# Patient Record
Sex: Male | Born: 1940 | Race: White | Hispanic: No | Marital: Single | Smoking: Never smoker
Health system: Southern US, Community
[De-identification: ages and names within clinical notes are randomized; demographics above are authoritative.]

## PROBLEM LIST (undated history)

## (undated) DIAGNOSIS — E291 Testicular hypofunction: Secondary | ICD-10-CM

## (undated) DIAGNOSIS — E785 Hyperlipidemia, unspecified: Secondary | ICD-10-CM

## (undated) DIAGNOSIS — R42 Dizziness and giddiness: Secondary | ICD-10-CM

## (undated) DIAGNOSIS — N401 Enlarged prostate with lower urinary tract symptoms: Secondary | ICD-10-CM

## (undated) DIAGNOSIS — I10 Essential (primary) hypertension: Secondary | ICD-10-CM

## (undated) DIAGNOSIS — R21 Rash and other nonspecific skin eruption: Secondary | ICD-10-CM

## (undated) DIAGNOSIS — M1712 Unilateral primary osteoarthritis, left knee: Secondary | ICD-10-CM

## (undated) DIAGNOSIS — N471 Phimosis: Secondary | ICD-10-CM

## (undated) DIAGNOSIS — N400 Enlarged prostate without lower urinary tract symptoms: Secondary | ICD-10-CM

## (undated) DIAGNOSIS — N472 Paraphimosis: Secondary | ICD-10-CM

## (undated) DIAGNOSIS — L309 Dermatitis, unspecified: Secondary | ICD-10-CM

## (undated) DIAGNOSIS — L409 Psoriasis, unspecified: Secondary | ICD-10-CM

## (undated) DIAGNOSIS — K219 Gastro-esophageal reflux disease without esophagitis: Secondary | ICD-10-CM

## (undated) DIAGNOSIS — D751 Secondary polycythemia: Secondary | ICD-10-CM

## (undated) DIAGNOSIS — N476 Balanoposthitis: Secondary | ICD-10-CM

## (undated) DIAGNOSIS — R55 Syncope and collapse: Secondary | ICD-10-CM

## (undated) HISTORY — DX: Benign prostatic hyperplasia without lower urinary tract symptoms: N40.0

## (undated) HISTORY — DX: Benign prostatic hyperplasia with lower urinary tract symptoms: N40.1

## (undated) HISTORY — DX: Gastro-esophageal reflux disease without esophagitis: K21.9

## (undated) HISTORY — DX: Testicular hypofunction: E29.1

## (undated) HISTORY — DX: Secondary polycythemia: D75.1

## (undated) HISTORY — PX: JOINT REPLACEMENT: SHX530

## (undated) HISTORY — DX: Phimosis: N47.1

## (undated) HISTORY — PX: KNEE SURGERY: SHX244

## (undated) HISTORY — PX: EYE SURGERY: SHX253

## (undated) HISTORY — DX: Balanoposthitis: N47.6

## (undated) HISTORY — DX: Hyperlipidemia, unspecified: E78.5

## (undated) HISTORY — PX: HERNIA REPAIR: SHX51

## (undated) HISTORY — DX: Rash and other nonspecific skin eruption: R21

## (undated) HISTORY — DX: Paraphimosis: N47.2

## (undated) HISTORY — DX: Essential (primary) hypertension: I10

---

## 2001-07-05 HISTORY — PX: KNEE SURGERY: SHX244

## 2006-01-04 ENCOUNTER — Ambulatory Visit: Payer: Self-pay | Admitting: Gastroenterology

## 2006-07-01 ENCOUNTER — Ambulatory Visit: Payer: Self-pay | Admitting: Cardiology

## 2009-12-24 ENCOUNTER — Ambulatory Visit: Payer: Self-pay | Admitting: Gastroenterology

## 2011-04-05 ENCOUNTER — Ambulatory Visit: Payer: Self-pay | Admitting: Internal Medicine

## 2011-05-06 ENCOUNTER — Ambulatory Visit: Payer: Self-pay | Admitting: Internal Medicine

## 2011-06-05 ENCOUNTER — Ambulatory Visit: Payer: Self-pay | Admitting: Internal Medicine

## 2011-07-06 ENCOUNTER — Ambulatory Visit: Payer: Self-pay | Admitting: Internal Medicine

## 2011-07-15 LAB — CBC CANCER CENTER
Eosinophil #: 0.1 x10 3/mm (ref 0.0–0.7)
Eosinophil %: 1.9 %
HCT: 47.1 % (ref 40.0–52.0)
Lymphocyte #: 1.5 x10 3/mm (ref 1.0–3.6)
Lymphocyte %: 26.2 %
MCV: 96 fL (ref 80–100)
Monocyte %: 6.9 %
Neutrophil #: 3.6 x10 3/mm (ref 1.4–6.5)
Platelet: 201 x10 3/mm (ref 150–440)
RBC: 4.91 10*6/uL (ref 4.40–5.90)

## 2011-08-06 ENCOUNTER — Ambulatory Visit: Payer: Self-pay | Admitting: Internal Medicine

## 2011-08-12 LAB — CBC CANCER CENTER
Basophil #: 0 x10 3/mm (ref 0.0–0.1)
Eosinophil %: 3.1 %
HCT: 47.3 % (ref 40.0–52.0)
HGB: 16.3 g/dL (ref 13.0–18.0)
Lymphocyte #: 1.2 x10 3/mm (ref 1.0–3.6)
Lymphocyte %: 25 %
MCH: 33.1 pg (ref 26.0–34.0)
MCHC: 34.5 g/dL (ref 32.0–36.0)
MCV: 96 fL (ref 80–100)
Monocyte #: 0.4 x10 3/mm (ref 0.0–0.7)
Monocyte %: 8.7 %
Neutrophil #: 3.1 x10 3/mm (ref 1.4–6.5)
Neutrophil %: 62.8 %
RBC: 4.94 10*6/uL (ref 4.40–5.90)
WBC: 4.9 x10 3/mm (ref 3.8–10.6)

## 2011-09-03 ENCOUNTER — Ambulatory Visit: Payer: Self-pay | Admitting: Internal Medicine

## 2011-09-03 ENCOUNTER — Ambulatory Visit: Payer: Self-pay | Admitting: Oncology

## 2011-09-10 LAB — CBC CANCER CENTER
Basophil #: 0 x10 3/mm (ref 0.0–0.1)
Basophil %: 0.6 %
HGB: 16.1 g/dL (ref 13.0–18.0)
Lymphocyte #: 1.3 x10 3/mm (ref 1.0–3.6)
MCH: 33.1 pg (ref 26.0–34.0)
MCHC: 34.6 g/dL (ref 32.0–36.0)
Neutrophil %: 63.5 %
Platelet: 171 x10 3/mm (ref 150–440)
RDW: 13.1 % (ref 11.5–14.5)

## 2011-10-04 ENCOUNTER — Ambulatory Visit: Payer: Self-pay | Admitting: Internal Medicine

## 2011-10-04 ENCOUNTER — Ambulatory Visit: Payer: Self-pay | Admitting: Oncology

## 2011-10-14 ENCOUNTER — Ambulatory Visit: Payer: Self-pay

## 2011-11-03 ENCOUNTER — Ambulatory Visit: Payer: Self-pay | Admitting: Internal Medicine

## 2011-12-03 LAB — CANCER CENTER HEMOGLOBIN: HGB: 15.4 g/dL (ref 13.0–18.0)

## 2011-12-04 ENCOUNTER — Ambulatory Visit: Payer: Self-pay | Admitting: Internal Medicine

## 2012-01-03 ENCOUNTER — Ambulatory Visit: Payer: Self-pay | Admitting: Internal Medicine

## 2012-01-28 LAB — CANCER CENTER HEMOGLOBIN: HGB: 16.2 g/dL (ref 13.0–18.0)

## 2012-02-03 ENCOUNTER — Ambulatory Visit: Payer: Self-pay

## 2012-02-03 ENCOUNTER — Ambulatory Visit: Payer: Self-pay | Admitting: Internal Medicine

## 2012-03-10 ENCOUNTER — Ambulatory Visit: Payer: Self-pay | Admitting: Oncology

## 2012-03-10 LAB — CANCER CENTER HEMOGLOBIN: HGB: 15.9 g/dL (ref 13.0–18.0)

## 2012-04-04 ENCOUNTER — Ambulatory Visit: Payer: Self-pay | Admitting: Oncology

## 2012-05-02 DIAGNOSIS — N3941 Urge incontinence: Secondary | ICD-10-CM | POA: Insufficient documentation

## 2012-05-02 DIAGNOSIS — Z79899 Other long term (current) drug therapy: Secondary | ICD-10-CM | POA: Insufficient documentation

## 2012-05-02 DIAGNOSIS — N138 Other obstructive and reflux uropathy: Secondary | ICD-10-CM | POA: Insufficient documentation

## 2012-05-02 DIAGNOSIS — D075 Carcinoma in situ of prostate: Secondary | ICD-10-CM | POA: Insufficient documentation

## 2012-05-02 DIAGNOSIS — E291 Testicular hypofunction: Secondary | ICD-10-CM | POA: Insufficient documentation

## 2012-05-02 DIAGNOSIS — R972 Elevated prostate specific antigen [PSA]: Secondary | ICD-10-CM | POA: Insufficient documentation

## 2012-05-02 DIAGNOSIS — R339 Retention of urine, unspecified: Secondary | ICD-10-CM | POA: Insufficient documentation

## 2012-11-01 DIAGNOSIS — D751 Secondary polycythemia: Secondary | ICD-10-CM | POA: Insufficient documentation

## 2012-11-01 DIAGNOSIS — E291 Testicular hypofunction: Secondary | ICD-10-CM | POA: Insufficient documentation

## 2014-05-13 DIAGNOSIS — Z85828 Personal history of other malignant neoplasm of skin: Secondary | ICD-10-CM | POA: Insufficient documentation

## 2014-09-23 ENCOUNTER — Ambulatory Visit: Payer: Self-pay | Admitting: Family Medicine

## 2014-10-22 ENCOUNTER — Other Ambulatory Visit: Payer: Self-pay | Admitting: Family Medicine

## 2014-10-22 DIAGNOSIS — R911 Solitary pulmonary nodule: Secondary | ICD-10-CM

## 2014-11-04 ENCOUNTER — Other Ambulatory Visit: Payer: Self-pay

## 2014-11-05 ENCOUNTER — Ambulatory Visit: Payer: Self-pay

## 2014-12-10 ENCOUNTER — Telehealth: Payer: Self-pay | Admitting: Urology

## 2014-12-10 NOTE — Telephone Encounter (Signed)
Pt called and left a Voice Mail about redness and drainage of a post-op area. Best contact # [phone] 12/10/14 maf

## 2014-12-11 NOTE — Telephone Encounter (Signed)
Pt stated at this time he spoke with someone and had that problem solved. Cw,lpn

## 2014-12-18 ENCOUNTER — Ambulatory Visit
Admission: RE | Admit: 2014-12-18 | Discharge: 2014-12-18 | Disposition: A | Discharge status: Home / Self Care | Payer: Medicare PPO | Source: Ambulatory Visit | Attending: Family Medicine | Admitting: Family Medicine

## 2014-12-18 DIAGNOSIS — R911 Solitary pulmonary nodule: Secondary | ICD-10-CM | POA: Insufficient documentation

## 2015-01-09 ENCOUNTER — Ambulatory Visit (INDEPENDENT_AMBULATORY_CARE_PROVIDER_SITE_OTHER): Payer: Self-pay | Admitting: Urology

## 2015-01-09 ENCOUNTER — Encounter: Payer: Self-pay | Admitting: Urology

## 2015-01-09 VITALS — BP 155/74 | HR 62 | Resp 18 | Ht 73.0 in | Wt 205.0 lb

## 2015-01-09 DIAGNOSIS — E785 Hyperlipidemia, unspecified: Secondary | ICD-10-CM | POA: Insufficient documentation

## 2015-01-09 DIAGNOSIS — N471 Phimosis: Secondary | ICD-10-CM

## 2015-01-09 DIAGNOSIS — K219 Gastro-esophageal reflux disease without esophagitis: Secondary | ICD-10-CM | POA: Insufficient documentation

## 2015-01-09 DIAGNOSIS — I10 Essential (primary) hypertension: Secondary | ICD-10-CM | POA: Insufficient documentation

## 2015-01-09 LAB — MICROSCOPIC EXAMINATION: BACTERIA UA: NONE SEEN

## 2015-01-09 LAB — URINALYSIS, COMPLETE
Bilirubin, UA: NEGATIVE
Glucose, UA: NEGATIVE
KETONES UA: NEGATIVE
Leukocytes, UA: NEGATIVE
NITRITE UA: NEGATIVE
PH UA: 5.5 (ref 5.0–7.5)
Protein, UA: NEGATIVE
RBC, UA: NEGATIVE
SPEC GRAV UA: 1.015 (ref 1.005–1.030)
Urobilinogen, Ur: 0.2 mg/dL (ref 0.2–1.0)

## 2015-01-09 NOTE — Progress Notes (Signed)
01/09/2015 9:48 AM   Al CorpusJames William Holmes 03/22/1941 [id number]  Referring provider: No referring provider defined for this encounter.  Chief Complaint  Patient presents with  . Follow-up  . Phimosis    HPI: Patient comes in 2 months postcircumcision. He had a hematoma post operatively which I had to express out through the incision. He has has no sequelae from this. Incision site and circumcision with good. He'll be seen in follow-up is necessary for any other problems. His family doctor doesn't excellent job with yearly PSA and gentle urinary exam.   PMH: History reviewed. No pertinent past medical history.  Surgical History: History reviewed. No pertinent past surgical history.  Home Medications:    Medication List       This list is accurate as of: 01/09/15  9:48 AM.  Always use your most recent med list.               amLODipine 10 MG tablet  Commonly known as:  NORVASC  Take by mouth.     aspirin EC 325 MG tablet  Take 325 mg by mouth.     betamethasone dipropionate 0.05 % cream  Commonly known as:  DIPROLENE     CENTRUM ULTRA MENS Tabs  Take by mouth.     clobetasol ointment 0.05 %  Commonly known as:  TEMOVATE  Apply to lower extremities up to daily (alternating with triamcinolone) for rash.     COZAAR 25 MG tablet  Generic drug:  losartan     finasteride 5 MG tablet  Commonly known as:  PROSCAR  Take 5 mg by mouth.     Fish Oil 1000 MG Caps  Take by mouth.     hydrochlorothiazide 25 MG tablet  Commonly known as:  HYDRODIURIL  Take 1 tablet by mouth daily.     metoprolol tartrate 25 MG tablet  Commonly known as:  LOPRESSOR  TAKE 1 TABLET (25 MG TOTAL) BY MOUTH 2 (TWO) TIMES DAILY.     nystatin-triamcinolone cream  Commonly known as:  MYCOLOG II     ranitidine 150 MG tablet  Commonly known as:  ZANTAC  Take by mouth.     spironolactone 25 MG tablet  Commonly known as:  ALDACTONE  Take by mouth.     TH VITAMIN E 1000 UNIT capsule    Generic drug:  vitamin E  Take by mouth.     triamcinolone cream 0.1 %  Commonly known as:  KENALOG  Apply to back, trunk, and legs up to twice daily as needed for itch.     vitamin C 1000 MG tablet  Take by mouth.        Allergies:  Allergies  Allergen Reactions  . Amoxicillin-Pot Clavulanate Other (See Comments)    Joint Pain in siblings- unknown in pt  . Codeine Sulfate Other (See Comments)    Family History: History reviewed. No pertinent family history.  Social History:  reports that he has never smoked. He does not have any smokeless tobacco history on file. He reports that he does not drink alcohol. His drug history is not on file.  WUJ:WJXBJYNROS:UROLOGY Frequent Urination?: No Hard to postpone urination?: No Burning/pain with urination?: No Get up at night to urinate?: No Leakage of urine?: No Urine stream starts and stops?: No Trouble starting stream?: No Do you have to strain to urinate?: No Blood in urine?: No Urinary tract infection?: No Sexually transmitted disease?: No Injury to kidneys or bladder?: No Painful intercourse?: No  Weak stream?: No Erection problems?: No Penile pain?: No Gastrointestinal Nausea?: No Vomiting?: No Indigestion/heartburn?: No Diarrhea?: No Constipation?: No Constitutional Fever: No Night sweats?: No Weight loss?: No Fatigue?: No Skin Skin rash/lesions?: No Itching?: No Eyes Blurred vision?: No Double vision?: No Ears/Nose/Throat Sore throat?: No Sinus problems?: No Hematologic/Lymphatic Swollen glands?: No Easy bruising?: No Cardiovascular Leg swelling?: No Chest pain?: No Respiratory Cough?: No Shortness of breath?: No Endocrine Excessive thirst?: No Musculoskeletal Back pain?: No Joint pain?: No Neurological Headaches?: No Dizziness?: No Psychologic Depression?: No Anxiety?: No    Urological Symptom Review     Physical Exam: BP 155/74 mmHg  Pulse 62  Resp 18  Ht  (1.854 m)  Wt 205 lb  (92.987 kg)  BMI 27.05 kg/m2  Constitutional:  Alert and oriented, No acute distress. HEENT: [place] AT, moist mucus membranes.  Trachea midline, no masses. Cardiovascular: No clubbing, cyanosis, or edema. Respiratory: Normal respiratory effort, no increased work of breathing. GI: Abdomen is soft, nontender, nondistended, no abdominal masses GU: No CVA tenderness. Well-healed circumcision site Skin: No rashes, bruises or suspicious lesions. Lymph: No cervical or inguinal adenopathy. Neurologic: Grossly intact, no focal deficits, moving all 4 extremities. Psychiatric: Normal mood and affect.  Laboratory Data: Lab Results  Component Value Date   WBC 4.9 09/10/2011   HGB 15.9 03/10/2012   HCT 46.4 09/10/2011   MCV 96 09/10/2011   PLT 171 09/10/2011    No results found for: CREATININE  No results found for: PSA  No results found for: TESTOSTERONE  No results found for: HGBA1C  Urinalysis No results found for: COLORURINE, APPEARANCEUR, LABSPEC, PHURINE, GLUCOSEU, HGBUR, BILIRUBINUR, KETONESUR, PROTEINUR, UROBILINOGEN, NITRITE, LEUKOCYTESUR  Pertinent Imaging: None  Assessment & Plan:  Well-healed circumcision site. Postoperative hematoma gone. Family physician will obtain yearly PSA and does rectal exam so the patient is now on a follow-up for prostate health evaluations.  1. Phimosis  - Urinalysis, Complete   No Follow-up on file.  Lorraine Lax, MD  [facility] [address] [place], [place] [address] [phone]

## 2015-05-19 ENCOUNTER — Ambulatory Visit: Payer: Medicare PPO | Admitting: Anesthesiology

## 2015-05-19 ENCOUNTER — Encounter
Admission: RE | Disposition: A | Discharge status: Home / Self Care | Payer: Self-pay | Source: Ambulatory Visit | Attending: Gastroenterology

## 2015-05-19 ENCOUNTER — Encounter: Payer: Self-pay | Admitting: *Deleted

## 2015-05-19 ENCOUNTER — Ambulatory Visit
Admission: RE | Admit: 2015-05-19 | Discharge: 2015-05-19 | Disposition: A | Discharge status: Home / Self Care | Payer: Medicare PPO | Source: Ambulatory Visit | Attending: Gastroenterology | Admitting: Gastroenterology

## 2015-05-19 DIAGNOSIS — Z881 Allergy status to other antibiotic agents status: Secondary | ICD-10-CM | POA: Diagnosis not present

## 2015-05-19 DIAGNOSIS — Z96651 Presence of right artificial knee joint: Secondary | ICD-10-CM | POA: Diagnosis not present

## 2015-05-19 DIAGNOSIS — D751 Secondary polycythemia: Secondary | ICD-10-CM | POA: Insufficient documentation

## 2015-05-19 DIAGNOSIS — N471 Phimosis: Secondary | ICD-10-CM | POA: Diagnosis not present

## 2015-05-19 DIAGNOSIS — Z880 Allergy status to penicillin: Secondary | ICD-10-CM | POA: Diagnosis not present

## 2015-05-19 DIAGNOSIS — Z800 Family history of malignant neoplasm of digestive organs: Secondary | ICD-10-CM | POA: Insufficient documentation

## 2015-05-19 DIAGNOSIS — Z885 Allergy status to narcotic agent status: Secondary | ICD-10-CM | POA: Diagnosis not present

## 2015-05-19 DIAGNOSIS — N401 Enlarged prostate with lower urinary tract symptoms: Secondary | ICD-10-CM | POA: Diagnosis not present

## 2015-05-19 DIAGNOSIS — N476 Balanoposthitis: Secondary | ICD-10-CM | POA: Insufficient documentation

## 2015-05-19 DIAGNOSIS — Z1211 Encounter for screening for malignant neoplasm of colon: Secondary | ICD-10-CM | POA: Insufficient documentation

## 2015-05-19 DIAGNOSIS — Z7982 Long term (current) use of aspirin: Secondary | ICD-10-CM | POA: Insufficient documentation

## 2015-05-19 DIAGNOSIS — K219 Gastro-esophageal reflux disease without esophagitis: Secondary | ICD-10-CM | POA: Diagnosis not present

## 2015-05-19 DIAGNOSIS — Z79899 Other long term (current) drug therapy: Secondary | ICD-10-CM | POA: Diagnosis not present

## 2015-05-19 DIAGNOSIS — Z8371 Family history of colonic polyps: Secondary | ICD-10-CM | POA: Diagnosis not present

## 2015-05-19 HISTORY — PX: COLONOSCOPY: SHX5424

## 2015-05-19 SURGERY — COLONOSCOPY
Anesthesia: General

## 2015-05-19 MED ORDER — LIDOCAINE HCL (CARDIAC) 20 MG/ML IV SOLN
INTRAVENOUS | Status: DC | PRN
  Administered 2015-05-19: 30 mg via INTRAVENOUS

## 2015-05-19 MED ORDER — SODIUM CHLORIDE 0.9 % IV SOLN: INTRAVENOUS | Status: DC

## 2015-05-19 MED ORDER — SODIUM CHLORIDE 0.9 % IV SOLN
INTRAVENOUS | Status: DC
  Administered 2015-05-19: 09:00:00 via INTRAVENOUS

## 2015-05-19 MED ORDER — PROPOFOL 500 MG/50ML IV EMUL
INTRAVENOUS | Status: DC | PRN
Start: 2015-05-19 — End: 2015-05-19
  Administered 2015-05-19: 100 ug/kg/min via INTRAVENOUS

## 2015-05-19 MED ORDER — PROPOFOL 10 MG/ML IV BOLUS
INTRAVENOUS | Status: DC | PRN
  Administered 2015-05-19: 100 mg via INTRAVENOUS
  Administered 2015-05-19: 40 mg via INTRAVENOUS

## 2015-05-19 NOTE — H&P (Signed)
Primary Care Physician:  [facility] DALE E, MD Primary Gastroenterologist:  Dr. Bluford Kaufmannh  Pre-Procedure History & Physical: HPI:  Earl OlpJames William Vierra is a 74 y.o. male is here for an colonoscopy.   Past Medical History  Diagnosis Date  . Hypertension   . Hyperlipidemia   . Polycythemia   . Hypogonadism in male   . GERD (gastroesophageal reflux disease)   . 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 Phimosis   . BPH (benign prostatic hyperplasia)   . Balanoposthitis   . Penile rash   . Paraphimosis   . Phimosis     Past Surgical History  Procedure Laterality Date  . Knee surgery    . Hernia repair      Prior to Admission medications   Medication Sig Start Date End Date Taking? Authorizing Provider  aspirin EC 325 MG tablet Take 325 mg by mouth. 05/02/12  Yes Historical Provider, MD  amLODipine (NORVASC) 10 MG tablet Take by mouth. 07/30/14   Historical Provider, MD  Ascorbic Acid (VITAMIN C) 1000 MG tablet Take by mouth.    Historical Provider, MD  betamethasone dipropionate (DIPROLENE) 0.05 % cream  07/06/14   Historical Provider, MD  clobetasol ointment (TEMOVATE) 0.05 % Apply to lower extremities up to daily (alternating with triamcinolone) for rash. 11/25/14   Historical Provider, MD  finasteride (PROSCAR) 5 MG tablet Take 5 mg by mouth. 09/10/13   Historical Provider, MD  hydrochlorothiazide (HYDRODIURIL) 25 MG tablet Take 1 tablet by mouth daily. 12/16/14   Historical Provider, MD  losartan (COZAAR) 25 MG tablet  10/07/14   Historical Provider, MD  metoprolol tartrate (LOPRESSOR) 25 MG tablet TAKE 1 TABLET (25 MG TOTAL) BY MOUTH 2 (TWO) TIMES DAILY. 11/04/14   Historical Provider, MD  Multiple Vitamins-Minerals (CENTRUM ULTRA MENS) TABS Take by mouth.    Historical Provider, MD  Omega-3 Fatty Acids (FISH OIL) 1000 MG CAPS Take by mouth.    Historical Provider, MD  ranitidine (ZANTAC) 150 MG tablet Take by mouth.    Historical Provider, MD  spironolactone  (ALDACTONE) 25 MG tablet Take by mouth. 12/13/14   Historical Provider, MD  triamcinolone cream (KENALOG) 0.1 % Apply to back, trunk, and legs up to twice daily as needed for itch. 11/25/14   Historical Provider, MD  vitamin E (TH VITAMIN E) 1000 UNIT capsule Take by mouth.    Historical Provider, MD    Allergies as of 04/25/2015 - Review Complete 01/09/2015  Allergen Reaction Noted  . Amoxicillin-pot clavulanate Other (See Comments) 01/09/2015  . Codeine sulfate Other (See Comments) 01/09/2015    Family History  Problem Relation Age of Onset  . Heart disease Father   . Stroke Mother   . Prostate cancer Neg Hx   . Bladder Cancer Neg Hx   . Kidney disease Neg Hx   . Heart disease Brother   . Liver disease Brother   . Liver disease Sister     Social History   Social History  . Marital Status: Single    Spouse Name: N/A  . Number of Children: N/A  . Years of Education: N/A   Occupational History  . Not on file.   Social History Main Topics  . Smoking status: Never Smoker   . Smokeless tobacco: Never Used  . Alcohol Use: No  . Drug Use: No  . Sexual Activity: Not on file   Other Topics Concern  . Not on file   Social History Narrative  Review of Systems: See HPI, otherwise negative ROS  Physical Exam: BP 144/81 mmHg  Pulse 59  Temp(Src) 97.7 F (36.5 C) (Tympanic)  Resp 18  Ht  (1.854 m)  Wt 92.987 kg (205 lb)  BMI 27.05 kg/m2  SpO2 97% General:   Alert,  pleasant and cooperative in NAD Head:  Normocephalic and atraumatic. Neck:  Supple; no masses or thyromegaly. Lungs:  Clear throughout to auscultation.    Heart:  Regular rate and rhythm. Abdomen:  Soft, nontender and nondistended. Normal bowel sounds, without guarding, and without rebound.   Neurologic:  Alert and  oriented x4;  grossly normal neurologically.  Impression/Plan: Earl Holmes is here for an colonoscopy to be performed for screening/family hx of colon polyps and  cancer.  Risks, benefits, limitations, and alternatives regarding  colonoscopy have been reviewed with the patient.  Questions have been answered.  All parties agreeable.   Cynthia Cogle, Ezzard Standing, MD  05/19/2015, 9:18 AM

## 2015-05-19 NOTE — Transfer of Care (Signed)
Immediate Anesthesia Transfer of Care Note  Patient: Earl Holmes  Procedure(s) Performed: Procedure(s): COLONOSCOPY (N/A)  Patient Location: Endoscopy Unit  Anesthesia Type:General  Level of Consciousness: sedated  Airway & Oxygen Therapy: Patient Spontanous Breathing and Patient connected to nasal cannula oxygen  Post-op Assessment: Report given to RN and Post -op Vital signs reviewed and stable  Post vital signs: Reviewed and stable  Last Vitals:  Filed Vitals:   05/19/15 0859  BP: 144/81  Pulse: 59  Temp: 36.5 C  Resp: 18    Complications: No apparent anesthesia complications

## 2015-05-19 NOTE — Anesthesia Preprocedure Evaluation (Signed)
Anesthesia Evaluation  Patient identified by MRN, date of birth, ID band Patient awake    Reviewed: Allergy & Precautions, H&P , NPO status , Patient's Chart, lab work & pertinent test results, reviewed documented beta blocker date and time   History of Anesthesia Complications Negative for: history of anesthetic complications  Airway Mallampati: III  TM Distance: >3 FB Neck ROM: full    Dental no notable dental hx. (+) Teeth Intact   Pulmonary neg pulmonary ROS,    Pulmonary exam normal breath sounds clear to auscultation       Cardiovascular Exercise Tolerance: Good hypertension, On Medications (-) angina(-) CAD, (-) Past MI, (-) Cardiac Stents and (-) CABG Normal cardiovascular exam(-) dysrhythmias (-) Valvular Problems/Murmurs Rhythm:regular Rate:Normal     Neuro/Psych negative neurological ROS  negative psych ROS   GI/Hepatic Neg liver ROS, GERD  Medicated and Controlled,  Endo/Other  negative endocrine ROS  Renal/GU negative Renal ROS  negative genitourinary   Musculoskeletal   Abdominal   Peds  Hematology negative hematology ROS (+)   Anesthesia Other Findings Past Medical History:   Hypertension                                                 Hyperlipidemia                                               Polycythemia                                                 Hypogonadism in male                                         GERD (gastroesophageal reflux disease)                       Enlarged prostate with lower urinary tract sym*              Phimosis                                                     BPH (benign prostatic hyperplasia)                           Balanoposthitis                                              Penile rash  Paraphimosis                                                 Phimosis                                                     Reproductive/Obstetrics negative OB ROS                             Anesthesia Physical Anesthesia Plan  ASA: II  Anesthesia Plan: General   Post-op Pain Management:    Induction:   Airway Management Planned:   Additional Equipment:   Intra-op Plan:   Post-operative Plan:   Informed Consent: I have reviewed the patients History and Physical, chart, labs and discussed the procedure including the risks, benefits and alternatives for the proposed anesthesia with the patient or authorized representative who has indicated his/her understanding and acceptance.   Dental Advisory Given  Plan Discussed with: Anesthesiologist and Surgeon  Anesthesia Plan Comments:         Anesthesia Quick Evaluation

## 2015-05-19 NOTE — Op Note (Signed)
[facility] Gastroenterology Patient Name: Earl Holmes Procedure Date: 05/19/2015 10:06 AM MRN: [id number] Account #: [id number] Date of Birth: 1941-01-31 Admit Type: Outpatient Age: 74 Room: [facility] ENDO ROOM 4 Gender: Male Note Status: Finalized Procedure:         Colonoscopy Indications:       Colon cancer screening in patient at increased risk:                     Family history of 1st-degree relative with colon polyps,                     Screening in patient at increased risk: Family history of                     1st-degree relative with colorectal cancer Providers:         Ezzard Standing. Bluford Kaufmann, MD Referring MD:      Marina Goodell (Referring MD) Medicines:         Monitored Anesthesia Care Complications:     No immediate complications. Procedure:         Pre-Anesthesia Assessment:                    - Prior to the procedure, a History and Physical was                     performed, and patient medications, allergies and                     sensitivities were reviewed. The patient's tolerance of                     previous anesthesia was reviewed.                    - The risks and benefits of the procedure and the sedation                     options and risks were discussed with the patient. All                     questions were answered and informed consent was obtained.                    - After reviewing the risks and benefits, the patient was                     deemed in satisfactory condition to undergo the procedure.                    After obtaining informed consent, the colonoscope was                     passed under direct vision. Throughout the procedure, the                     patient's blood pressure, pulse, and oxygen saturations                     were monitored continuously. The Olympus CF-Q160AL                     colonoscope (S#. [id number]) was introduced through the anus  and advanced to the the cecum, identified by  appendiceal                     orifice and ileocecal valve. The colonoscopy was performed                     without difficulty. The patient tolerated the procedure                     well. The quality of the bowel preparation was good. Findings:      The colon (entire examined portion) appeared normal. Impression:        - The entire examined colon is normal.                    - No specimens collected. Recommendation:    - Discharge patient to home.                    - The findings and recommendations were discussed with the                     patient. Procedure Code(s): --- Professional ---                    [id number], Colonoscopy, flexible; diagnostic, including                     collection of specimen(s) by brushing or washing, when                     performed (separate procedure) Diagnosis Code(s): --- Professional ---                    Z83.71, Family history of colonic polyps                    Z80.0, Family history of malignant neoplasm of digestive                     organs CPT copyright 2014 American Medical Association. All rights reserved. The codes documented in this report are preliminary and upon coder review may  be revised to meet current compliance requirements. Wallace CullensPaul Y Angelmarie Ponzo, MD 05/19/2015 10:27:06 AM This report has been signed electronically. Number of Addenda: 0 Note Initiated On: 05/19/2015 10:06 AM Scope Withdrawal Time: 0 hours 5 minutes 13 seconds  Total Procedure Duration: 0 hours 9 minutes 2 seconds       [facility], [facility]

## 2015-05-20 NOTE — Anesthesia Postprocedure Evaluation (Signed)
  Anesthesia Post-op Note  Patient: Earl Holmes  Procedure(s) Performed: Procedure(s): COLONOSCOPY (N/A)  Anesthesia type:General  Patient location: PACU  Post pain: Pain level controlled  Post assessment: Post-op Vital signs reviewed, Patient's Cardiovascular Status Stable, Respiratory Function Stable, Patent Airway and No signs of Nausea or vomiting  Post vital signs: Reviewed and stable  Last Vitals:  Filed Vitals:   05/19/15 1100  BP: 145/83  Pulse: 58  Temp:   Resp: 13    Level of consciousness: awake, alert  and patient cooperative  Complications: No apparent anesthesia complications

## 2015-05-21 ENCOUNTER — Encounter: Payer: Self-pay | Admitting: Gastroenterology

## 2018-07-18 ENCOUNTER — Encounter: Payer: Self-pay | Admitting: *Deleted

## 2018-07-18 ENCOUNTER — Other Ambulatory Visit: Payer: Self-pay

## 2018-07-20 NOTE — Discharge Instructions (Signed)

## 2018-07-24 NOTE — Anesthesia Preprocedure Evaluation (Addendum)
Anesthesia Evaluation  Patient identified by MRN, date of birth, ID band Patient awake    Reviewed: Allergy & Precautions, NPO status , Patient's Chart, lab work & pertinent test results  History of Anesthesia Complications Negative for: history of anesthetic complications  Airway Mallampati: II   Neck ROM: Full    Dental  (+)    Pulmonary neg pulmonary ROS,    Pulmonary exam normal breath sounds clear to auscultation       Cardiovascular Exercise Tolerance: Good hypertension, Normal cardiovascular exam Rhythm:Regular Rate:Normal     Neuro/Psych Vertigo     GI/Hepatic GERD  ,  Endo/Other  negative endocrine ROS  Renal/GU negative Renal ROS     Musculoskeletal   Abdominal   Peds  Hematology negative hematology ROS (+)   Anesthesia Other Findings BPH  Reproductive/Obstetrics                            Anesthesia Physical Anesthesia Plan  ASA: II  Anesthesia Plan: MAC   Post-op Pain Management:    Induction: Intravenous  PONV Risk Score and Plan: 1 and TIVA and Midazolam  Airway Management Planned: Natural Airway  Additional Equipment:   Intra-op Plan:   Post-operative Plan:   Informed Consent: I have reviewed the patients History and Physical, chart, labs and discussed the procedure including the risks, benefits and alternatives for the proposed anesthesia with the patient or authorized representative who has indicated his/her understanding and acceptance.       Plan Discussed with: CRNA  Anesthesia Plan Comments:        Anesthesia Quick Evaluation

## 2018-07-26 ENCOUNTER — Ambulatory Visit
Admission: RE | Admit: 2018-07-26 | Discharge: 2018-07-26 | Disposition: A | Discharge status: Home / Self Care | Payer: Medicare HMO | Attending: Ophthalmology | Admitting: Ophthalmology

## 2018-07-26 ENCOUNTER — Ambulatory Visit: Payer: Medicare HMO | Admitting: Anesthesiology

## 2018-07-26 ENCOUNTER — Encounter
Admission: RE | Disposition: A | Discharge status: Home / Self Care | Payer: Self-pay | Source: Home / Self Care | Attending: Ophthalmology

## 2018-07-26 DIAGNOSIS — N400 Enlarged prostate without lower urinary tract symptoms: Secondary | ICD-10-CM | POA: Insufficient documentation

## 2018-07-26 DIAGNOSIS — Z79899 Other long term (current) drug therapy: Secondary | ICD-10-CM | POA: Insufficient documentation

## 2018-07-26 DIAGNOSIS — Z7982 Long term (current) use of aspirin: Secondary | ICD-10-CM | POA: Diagnosis not present

## 2018-07-26 DIAGNOSIS — H2511 Age-related nuclear cataract, right eye: Secondary | ICD-10-CM | POA: Diagnosis not present

## 2018-07-26 DIAGNOSIS — I10 Essential (primary) hypertension: Secondary | ICD-10-CM | POA: Insufficient documentation

## 2018-07-26 DIAGNOSIS — Z96651 Presence of right artificial knee joint: Secondary | ICD-10-CM | POA: Insufficient documentation

## 2018-07-26 HISTORY — DX: Dizziness and giddiness: R42

## 2018-07-26 HISTORY — DX: Dermatitis, unspecified: L30.9

## 2018-07-26 HISTORY — PX: CATARACT EXTRACTION W/PHACO: SHX586

## 2018-07-26 HISTORY — DX: Psoriasis, unspecified: L40.9

## 2018-07-26 SURGERY — PHACOEMULSIFICATION, CATARACT, WITH IOL INSERTION
Anesthesia: Monitor Anesthesia Care | Laterality: Right

## 2018-07-26 MED ORDER — FENTANYL CITRATE (PF) 100 MCG/2ML IJ SOLN
INTRAMUSCULAR | Status: DC | PRN
  Administered 2018-07-26 (×2): 50 ug via INTRAVENOUS

## 2018-07-26 MED ORDER — MIDAZOLAM HCL 2 MG/2ML IJ SOLN
INTRAMUSCULAR | Status: DC | PRN
  Administered 2018-07-26 (×2): 1 mg via INTRAVENOUS

## 2018-07-26 MED ORDER — ARMC OPHTHALMIC DILATING DROPS
1.0000 "application " | OPHTHALMIC | Status: DC | PRN
  Administered 2018-07-26 (×3): 1 via OPHTHALMIC

## 2018-07-26 MED ORDER — TETRACAINE HCL 0.5 % OP SOLN
1.0000 [drp] | OPHTHALMIC | Status: DC | PRN
  Administered 2018-07-26 (×2): 1 [drp] via OPHTHALMIC

## 2018-07-26 MED ORDER — ONDANSETRON HCL 4 MG/2ML IJ SOLN: 4.0000 mg | Freq: Once | INTRAMUSCULAR | Status: DC | PRN

## 2018-07-26 MED ORDER — BRIMONIDINE TARTRATE-TIMOLOL 0.2-0.5 % OP SOLN
OPHTHALMIC | Status: DC | PRN
  Administered 2018-07-26: 1 [drp] via OPHTHALMIC

## 2018-07-26 MED ORDER — LIDOCAINE HCL (PF) 2 % IJ SOLN
INTRAOCULAR | Status: DC | PRN
  Administered 2018-07-26: 1 mL via INTRAMUSCULAR

## 2018-07-26 MED ORDER — ACETAMINOPHEN 160 MG/5ML PO SOLN: 325.0000 mg | ORAL | Status: DC | PRN

## 2018-07-26 MED ORDER — EPINEPHRINE PF 1 MG/ML IJ SOLN
INTRAOCULAR | Status: DC | PRN
  Administered 2018-07-26: 77 mL via OPHTHALMIC

## 2018-07-26 MED ORDER — CEFUROXIME OPHTHALMIC INJECTION 1 MG/0.1 ML
INJECTION | OPHTHALMIC | Status: DC | PRN
  Administered 2018-07-26: .2 mL via INTRACAMERAL

## 2018-07-26 MED ORDER — LACTATED RINGERS IV SOLN: INTRAVENOUS | Status: DC

## 2018-07-26 MED ORDER — ACETAMINOPHEN 325 MG PO TABS: 650.0000 mg | ORAL_TABLET | Freq: Once | ORAL | Status: DC | PRN

## 2018-07-26 MED ORDER — MOXIFLOXACIN HCL 0.5 % OP SOLN
1.0000 [drp] | OPHTHALMIC | Status: DC | PRN
  Administered 2018-07-26 (×3): 1 [drp] via OPHTHALMIC

## 2018-07-26 MED ORDER — NA HYALUR & NA CHOND-NA HYALUR 0.4-0.35 ML IO KIT
PACK | INTRAOCULAR | Status: DC | PRN
  Administered 2018-07-26: 1 mL via INTRAOCULAR

## 2018-07-26 SURGICAL SUPPLY — 26 items
CANNULA ANT/CHMB 27G (MISCELLANEOUS) ×1 IMPLANT
CANNULA ANT/CHMB 27GA (MISCELLANEOUS) ×3 IMPLANT
GLOVE SURG LX 7.5 STRW (GLOVE) ×2
GLOVE SURG LX STRL 7.5 STRW (GLOVE) ×1 IMPLANT
GLOVE SURG TRIUMPH 8.0 PF LTX (GLOVE) ×3 IMPLANT
GOWN STRL REUS W/ TWL LRG LVL3 (GOWN DISPOSABLE) ×2 IMPLANT
GOWN STRL REUS W/TWL LRG LVL3 (GOWN DISPOSABLE) ×4
LENS IOL TECNIS ITEC 18.5 (Intraocular Lens) ×2 IMPLANT
MARKER SKIN DUAL TIP RULER LAB (MISCELLANEOUS) ×3 IMPLANT
NDL FILTER BLUNT 18X1 1/2 (NEEDLE) ×1 IMPLANT
NDL RETROBULBAR .5 NSTRL (NEEDLE) IMPLANT
NEEDLE FILTER BLUNT 18X 1/2SAF (NEEDLE) ×2
NEEDLE FILTER BLUNT 18X1 1/2 (NEEDLE) ×1 IMPLANT
PACK CATARACT BRASINGTON (MISCELLANEOUS) ×3 IMPLANT
PACK EYE AFTER SURG (MISCELLANEOUS) ×3 IMPLANT
PACK OPTHALMIC (MISCELLANEOUS) ×3 IMPLANT
RING MALYGIN 7.0 (MISCELLANEOUS) IMPLANT
SUT ETHILON 10-0 CS-B-6CS-B-6 (SUTURE)
SUT VICRYL  9 0 (SUTURE)
SUT VICRYL 9 0 (SUTURE) IMPLANT
SUTURE EHLN 10-0 CS-B-6CS-B-6 (SUTURE) IMPLANT
SYR 3ML LL SCALE MARK (SYRINGE) ×3 IMPLANT
SYR 5ML LL (SYRINGE) ×3 IMPLANT
SYR TB 1ML LUER SLIP (SYRINGE) ×3 IMPLANT
WATER STERILE IRR 500ML POUR (IV SOLUTION) ×3 IMPLANT
WIPE NON LINTING 3.25X3.25 (MISCELLANEOUS) ×3 IMPLANT

## 2018-07-26 NOTE — H&P (Signed)

## 2018-07-26 NOTE — Op Note (Signed)
LOCATION:  [facility]   PREOPERATIVE DIAGNOSIS:    Nuclear sclerotic cataract right eye. H25.11   POSTOPERATIVE DIAGNOSIS:  Nuclear sclerotic cataract right eye.     PROCEDURE:  Phacoemusification with posterior chamber intraocular lens placement of the right eye   LENS:   Implant Name Type Inv. Item Serial No. Manufacturer Lot No. LRB No. Used  Tecnis 1 aspheric IOL Intraocular Lens  [id number] [employer]  Right 1     PCB00 18.5 D   ULTRASOUND TIME: 21 % of 1 minutes, 26 seconds.  CDE 18.3   SURGEON:  Deirdre Evener, MD   ANESTHESIA:  Topical with tetracaine drops and 2% Xylocaine jelly, augmented with 1% preservative-free intracameral lidocaine.    COMPLICATIONS:  None.   DESCRIPTION OF PROCEDURE:  The patient was identified in the holding room and transported to the operating room and placed in the supine position under the operating microscope.  The right eye was identified as the operative eye and it was prepped and draped in the usual sterile ophthalmic fashion.   A 1 millimeter clear-corneal paracentesis was made at the 12:00 position.  0.5 ml of preservative-free 1% lidocaine was injected into the anterior chamber. The anterior chamber was filled with Viscoat viscoelastic.  A 2.4 millimeter keratome was used to make a near-clear corneal incision at the 9:00 position.  A curvilinear capsulorrhexis was made with a cystotome and capsulorrhexis forceps.  Balanced salt solution was used to hydrodissect and hydrodelineate the nucleus.   Phacoemulsification was then used in stop and chop fashion to remove the lens nucleus and epinucleus.  The remaining cortex was then removed using the irrigation and aspiration handpiece. Provisc was then placed into the capsular bag to distend it for lens placement.  A lens was then injected into the capsular bag.  The remaining viscoelastic was aspirated.   Wounds were hydrated with balanced salt solution.  The anterior  chamber was inflated to a physiologic pressure with balanced salt solution.  No wound leaks were noted. Cefuroxime 0.1 ml of a 10mg /ml solution was injected into the anterior chamber for a dose of 1 mg of intracameral antibiotic at the completion of the case.   Timolol and Brimonidine drops were applied to the eye.  The patient was taken to the recovery room in stable condition without complications of anesthesia or surgery.   Lennyx Verdell 07/26/2018, 10:05 AM

## 2018-07-26 NOTE — Transfer of Care (Signed)
Immediate Anesthesia Transfer of Care Note  Patient: Earl Holmes  Procedure(s) Performed: CATARACT EXTRACTION PHACO AND INTRAOCULAR LENS PLACEMENT (IOC)  RIGHT (Right )  Patient Location: PACU  Anesthesia Type: MAC  Level of Consciousness: awake, alert  and patient cooperative  Airway and Oxygen Therapy: Patient Spontanous Breathing and Patient connected to supplemental oxygen  Post-op Assessment: Post-op Vital signs reviewed, Patient's Cardiovascular Status Stable, Respiratory Function Stable, Patent Airway and No signs of Nausea or vomiting  Post-op Vital Signs: Reviewed and stable  Complications: No apparent anesthesia complications

## 2018-07-26 NOTE — Anesthesia Procedure Notes (Signed)
Procedure Name: MAC Performed by: Izetta Dakin, CRNA Pre-anesthesia Checklist: Timeout performed, Patient being monitored, Suction available, Emergency Drugs available and Patient identified Patient Re-evaluated:Patient Re-evaluated prior to induction Oxygen Delivery Method: Nasal cannula

## 2018-07-26 NOTE — Anesthesia Postprocedure Evaluation (Signed)
Anesthesia Post Note  Patient: Earl Holmes  Procedure(s) Performed: CATARACT EXTRACTION PHACO AND INTRAOCULAR LENS PLACEMENT (IOC)  RIGHT (Right )  Patient location during evaluation: PACU Anesthesia Type: MAC Level of consciousness: awake and alert, oriented and patient cooperative Pain management: pain level controlled Vital Signs Assessment: post-procedure vital signs reviewed and stable Respiratory status: spontaneous breathing, nonlabored ventilation and respiratory function stable Cardiovascular status: blood pressure returned to baseline and stable Postop Assessment: adequate PO intake Anesthetic complications: no    Darrin Nipper

## 2018-07-27 ENCOUNTER — Encounter: Payer: Self-pay | Admitting: Ophthalmology

## 2018-08-31 ENCOUNTER — Encounter: Payer: Self-pay | Admitting: *Deleted

## 2018-08-31 ENCOUNTER — Other Ambulatory Visit: Payer: Self-pay

## 2018-09-07 NOTE — Discharge Instructions (Signed)

## 2018-09-12 ENCOUNTER — Ambulatory Visit: Payer: Medicare HMO | Admitting: Anesthesiology

## 2018-09-12 ENCOUNTER — Ambulatory Visit
Admission: RE | Admit: 2018-09-12 | Discharge: 2018-09-12 | Disposition: A | Discharge status: Home / Self Care | Payer: Medicare HMO | Attending: Ophthalmology | Admitting: Ophthalmology

## 2018-09-12 ENCOUNTER — Encounter
Admission: RE | Disposition: A | Discharge status: Home / Self Care | Payer: Self-pay | Source: Home / Self Care | Attending: Ophthalmology

## 2018-09-12 DIAGNOSIS — Z96651 Presence of right artificial knee joint: Secondary | ICD-10-CM | POA: Diagnosis not present

## 2018-09-12 DIAGNOSIS — Z79899 Other long term (current) drug therapy: Secondary | ICD-10-CM | POA: Insufficient documentation

## 2018-09-12 DIAGNOSIS — I10 Essential (primary) hypertension: Secondary | ICD-10-CM | POA: Diagnosis not present

## 2018-09-12 DIAGNOSIS — D751 Secondary polycythemia: Secondary | ICD-10-CM | POA: Insufficient documentation

## 2018-09-12 DIAGNOSIS — N400 Enlarged prostate without lower urinary tract symptoms: Secondary | ICD-10-CM | POA: Diagnosis not present

## 2018-09-12 DIAGNOSIS — H2512 Age-related nuclear cataract, left eye: Secondary | ICD-10-CM | POA: Insufficient documentation

## 2018-09-12 DIAGNOSIS — Z7982 Long term (current) use of aspirin: Secondary | ICD-10-CM | POA: Diagnosis not present

## 2018-09-12 HISTORY — PX: CATARACT EXTRACTION W/PHACO: SHX586

## 2018-09-12 SURGERY — PHACOEMULSIFICATION, CATARACT, WITH IOL INSERTION
Anesthesia: Monitor Anesthesia Care | Site: Eye | Laterality: Left

## 2018-09-12 MED ORDER — MOXIFLOXACIN HCL 0.5 % OP SOLN
1.0000 [drp] | OPHTHALMIC | Status: DC | PRN
  Administered 2018-09-12 (×3): 1 [drp] via OPHTHALMIC

## 2018-09-12 MED ORDER — TETRACAINE HCL 0.5 % OP SOLN
1.0000 [drp] | OPHTHALMIC | Status: DC | PRN
  Administered 2018-09-12 (×2): 1 [drp] via OPHTHALMIC

## 2018-09-12 MED ORDER — LACTATED RINGERS IV SOLN: INTRAVENOUS | Status: DC

## 2018-09-12 MED ORDER — CEFUROXIME OPHTHALMIC INJECTION 1 MG/0.1 ML
INJECTION | OPHTHALMIC | Status: DC | PRN
  Administered 2018-09-12: 0.1 mL via INTRACAMERAL

## 2018-09-12 MED ORDER — ACETAMINOPHEN 325 MG PO TABS: 325.0000 mg | ORAL_TABLET | ORAL | Status: DC | PRN

## 2018-09-12 MED ORDER — NA HYALUR & NA CHOND-NA HYALUR 0.4-0.35 ML IO KIT
PACK | INTRAOCULAR | Status: DC | PRN
  Administered 2018-09-12: 1 mL via INTRAOCULAR

## 2018-09-12 MED ORDER — MIDAZOLAM HCL 2 MG/2ML IJ SOLN
INTRAMUSCULAR | Status: DC | PRN
  Administered 2018-09-12 (×2): 1 mg via INTRAVENOUS

## 2018-09-12 MED ORDER — LIDOCAINE HCL (PF) 2 % IJ SOLN
INTRAOCULAR | Status: DC | PRN
  Administered 2018-09-12: 2 mL

## 2018-09-12 MED ORDER — PROPOFOL 10 MG/ML IV BOLUS
INTRAVENOUS | Status: DC | PRN
  Administered 2018-09-12: 30 mg via INTRAVENOUS

## 2018-09-12 MED ORDER — BRIMONIDINE TARTRATE-TIMOLOL 0.2-0.5 % OP SOLN
OPHTHALMIC | Status: DC | PRN
  Administered 2018-09-12: 1 [drp] via OPHTHALMIC

## 2018-09-12 MED ORDER — EPINEPHRINE PF 1 MG/ML IJ SOLN
INTRAOCULAR | Status: DC | PRN
  Administered 2018-09-12: 68 mL via OPHTHALMIC

## 2018-09-12 MED ORDER — ARMC OPHTHALMIC DILATING DROPS
1.0000 "application " | OPHTHALMIC | Status: DC | PRN
  Administered 2018-09-12 (×3): 1 via OPHTHALMIC

## 2018-09-12 MED ORDER — ACETAMINOPHEN 160 MG/5ML PO SOLN: 325.0000 mg | ORAL | Status: DC | PRN

## 2018-09-12 SURGICAL SUPPLY — 20 items
CANNULA ANT/CHMB 27G (MISCELLANEOUS) ×1 IMPLANT
CANNULA ANT/CHMB 27GA (MISCELLANEOUS) ×3 IMPLANT
GLOVE SURG LX 7.5 STRW (GLOVE) ×2
GLOVE SURG LX STRL 7.5 STRW (GLOVE) ×1 IMPLANT
GLOVE SURG TRIUMPH 8.0 PF LTX (GLOVE) ×3 IMPLANT
GOWN STRL REUS W/ TWL LRG LVL3 (GOWN DISPOSABLE) ×2 IMPLANT
GOWN STRL REUS W/TWL LRG LVL3 (GOWN DISPOSABLE) ×4
LENS IOL TECNIS ITEC 20.0 (Intraocular Lens) ×2 IMPLANT
MARKER SKIN DUAL TIP RULER LAB (MISCELLANEOUS) ×3 IMPLANT
NDL FILTER BLUNT 18X1 1/2 (NEEDLE) ×1 IMPLANT
NEEDLE FILTER BLUNT 18X 1/2SAF (NEEDLE) ×2
NEEDLE FILTER BLUNT 18X1 1/2 (NEEDLE) ×1 IMPLANT
PACK CATARACT BRASINGTON (MISCELLANEOUS) ×3 IMPLANT
PACK EYE AFTER SURG (MISCELLANEOUS) ×3 IMPLANT
PACK OPTHALMIC (MISCELLANEOUS) ×3 IMPLANT
SYR 3ML LL SCALE MARK (SYRINGE) ×3 IMPLANT
SYR 5ML LL (SYRINGE) ×3 IMPLANT
SYR TB 1ML LUER SLIP (SYRINGE) ×3 IMPLANT
WATER STERILE IRR 500ML POUR (IV SOLUTION) ×3 IMPLANT
WIPE NON LINTING 3.25X3.25 (MISCELLANEOUS) ×3 IMPLANT

## 2018-09-12 NOTE — Transfer of Care (Signed)
Immediate Anesthesia Transfer of Care Note  Patient: Earl Holmes  Procedure(s) Performed: CATARACT EXTRACTION PHACO AND INTRAOCULAR LENS PLACEMENT (IOC) LEFT (Left Eye)  Patient Location: PACU  Anesthesia Type: MAC  Level of Consciousness: awake, alert  and patient cooperative  Airway and Oxygen Therapy: Patient Spontanous Breathing and Patient connected to supplemental oxygen  Post-op Assessment: Post-op Vital signs reviewed, Patient's Cardiovascular Status Stable, Respiratory Function Stable, Patent Airway and No signs of Nausea or vomiting  Post-op Vital Signs: Reviewed and stable  Complications: No apparent anesthesia complications

## 2018-09-12 NOTE — Op Note (Signed)
OPERATIVE NOTE  Earl Holmes [id number] 09/12/2018   PREOPERATIVE DIAGNOSIS:  Nuclear sclerotic cataract left eye. H25.12   POSTOPERATIVE DIAGNOSIS:    Nuclear sclerotic cataract left eye.     PROCEDURE:  Phacoemusification with posterior chamber intraocular lens placement of the left eye   LENS:   Implant Name Type Inv. Item Serial No. Manufacturer Lot No. LRB No. Used  LENS IOL DIOP 20.0 - [id number] Intraocular Lens LENS IOL DIOP 20.0 [id number]  Left 1        ULTRASOUND TIME: 24  % of 1 minutes 22 seconds, CDE 19.8  SURGEON:  Deirdre Evener, MD   ANESTHESIA:  Topical with tetracaine drops and 2% Xylocaine jelly, augmented with 1% preservative-free intracameral lidocaine.    COMPLICATIONS:  None.   DESCRIPTION OF PROCEDURE:  The patient was identified in the holding room and transported to the operating room and placed in the supine position under the operating microscope.  The left eye was identified as the operative eye and it was prepped and draped in the usual sterile ophthalmic fashion.   A 1 millimeter clear-corneal paracentesis was made at the 1:30 position.  0.5 ml of preservative-free 1% lidocaine was injected into the anterior chamber.  The anterior chamber was filled with Viscoat viscoelastic.  A 2.4 millimeter keratome was used to make a near-clear corneal incision at the 10:30 position.  .  A curvilinear capsulorrhexis was made with a cystotome and capsulorrhexis forceps.  Balanced salt solution was used to hydrodissect and hydrodelineate the nucleus.   Phacoemulsification was then used in stop and chop fashion to remove the lens nucleus and epinucleus.  The remaining cortex was then removed using the irrigation and aspiration handpiece. Provisc was then placed into the capsular bag to distend it for lens placement.  A lens was then injected into the capsular bag.  The remaining viscoelastic was aspirated.   Wounds were hydrated with balanced salt  solution.  The anterior chamber was inflated to a physiologic pressure with balanced salt solution.  No wound leaks were noted. Cefuroxime 0.1 ml of a 10mg /ml solution was injected into the anterior chamber for a dose of 1 mg of intracameral antibiotic at the completion of the case.   Timolol and Brimonidine drops were applied to the eye.  The patient was taken to the recovery room in stable condition without complications of anesthesia or surgery.  [name] 09/12/2018, 11:13 AM

## 2018-09-12 NOTE — Anesthesia Postprocedure Evaluation (Signed)
Anesthesia Post Note  Patient: Earl Holmes  Procedure(s) Performed: CATARACT EXTRACTION PHACO AND INTRAOCULAR LENS PLACEMENT (IOC) LEFT (Left Eye)  Patient location during evaluation: PACU Anesthesia Type: MAC Level of consciousness: awake and alert Pain management: pain level controlled Vital Signs Assessment: post-procedure vital signs reviewed and stable Respiratory status: spontaneous breathing, nonlabored ventilation, respiratory function stable and patient connected to nasal cannula oxygen Cardiovascular status: stable and blood pressure returned to baseline Postop Assessment: no apparent nausea or vomiting Anesthetic complications: no    Alisa Graff

## 2018-09-12 NOTE — Anesthesia Procedure Notes (Signed)
Procedure Name: MAC Date/Time: 09/12/2018 10:52 AM Performed by: Cameron Ali, CRNA Pre-anesthesia Checklist: Patient identified, Emergency Drugs available, Suction available, Timeout performed and Patient being monitored Patient Re-evaluated:Patient Re-evaluated prior to induction Oxygen Delivery Method: Nasal cannula Placement Confirmation: positive ETCO2

## 2018-09-12 NOTE — Anesthesia Preprocedure Evaluation (Addendum)
Anesthesia Evaluation  Patient identified by MRN, date of birth, ID band Patient awake    Reviewed: Allergy & Precautions, H&P , NPO status , Patient's Chart, lab work & pertinent test results, reviewed documented beta blocker date and time   Airway Mallampati: II  TM Distance: >3 FB Neck ROM: full    Dental no notable dental hx.    Pulmonary neg pulmonary ROS,    Pulmonary exam normal breath sounds clear to auscultation       Cardiovascular Exercise Tolerance: Good hypertension, negative cardio ROS   Rhythm:regular Rate:Normal     Neuro/Psych vertigo negative psych ROS   GI/Hepatic Neg liver ROS, GERD  ,  Endo/Other  negative endocrine ROS  Renal/GU negative Renal ROS   Hypogonadism, BPH    Musculoskeletal   Abdominal   Peds  Hematology polycythemia   Anesthesia Other Findings   Reproductive/Obstetrics negative OB ROS                            Anesthesia Physical Anesthesia Plan  ASA: II  Anesthesia Plan: MAC   Post-op Pain Management:    Induction:   PONV Risk Score and Plan:   Airway Management Planned:   Additional Equipment:   Intra-op Plan:   Post-operative Plan:   Informed Consent: I have reviewed the patients History and Physical, chart, labs and discussed the procedure including the risks, benefits and alternatives for the proposed anesthesia with the patient or authorized representative who has indicated his/her understanding and acceptance.     Dental Advisory Given  Plan Discussed with: CRNA  Anesthesia Plan Comments:         Anesthesia Quick Evaluation

## 2018-09-12 NOTE — H&P (Signed)

## 2018-09-13 ENCOUNTER — Encounter: Payer: Self-pay | Admitting: Ophthalmology

## 2018-09-20 ENCOUNTER — Other Ambulatory Visit: Payer: Self-pay | Admitting: Family Medicine

## 2018-09-20 ENCOUNTER — Other Ambulatory Visit: Payer: Self-pay

## 2018-09-20 ENCOUNTER — Ambulatory Visit
Admission: RE | Admit: 2018-09-20 | Discharge: 2018-09-20 | Disposition: A | Discharge status: Home / Self Care | Payer: Medicare HMO | Source: Ambulatory Visit | Attending: Family Medicine | Admitting: Family Medicine

## 2018-09-20 DIAGNOSIS — M542 Cervicalgia: Secondary | ICD-10-CM

## 2019-06-14 ENCOUNTER — Other Ambulatory Visit: Payer: Self-pay | Admitting: Neurology

## 2019-06-14 DIAGNOSIS — R55 Syncope and collapse: Secondary | ICD-10-CM

## 2019-06-18 DIAGNOSIS — R55 Syncope and collapse: Secondary | ICD-10-CM | POA: Insufficient documentation

## 2019-06-24 ENCOUNTER — Ambulatory Visit
Admission: RE | Admit: 2019-06-24 | Discharge: 2019-06-24 | Disposition: A | Discharge status: Home / Self Care | Payer: Medicare HMO | Source: Ambulatory Visit | Attending: Neurology | Admitting: Neurology

## 2019-06-24 DIAGNOSIS — R55 Syncope and collapse: Secondary | ICD-10-CM | POA: Diagnosis present

## 2019-10-30 DIAGNOSIS — K644 Residual hemorrhoidal skin tags: Secondary | ICD-10-CM | POA: Insufficient documentation

## 2019-11-20 ENCOUNTER — Other Ambulatory Visit
Admission: RE | Admit: 2019-11-20 | Discharge: 2019-11-20 | Disposition: A | Discharge status: Home / Self Care | Payer: Medicare HMO | Source: Ambulatory Visit | Attending: Surgery | Admitting: Surgery

## 2019-11-20 DIAGNOSIS — Z01812 Encounter for preprocedural laboratory examination: Secondary | ICD-10-CM | POA: Diagnosis present

## 2019-11-20 DIAGNOSIS — Z20822 Contact with and (suspected) exposure to covid-19: Secondary | ICD-10-CM | POA: Diagnosis not present

## 2019-11-20 LAB — SARS CORONAVIRUS 2 (TAT 6-24 HRS): SARS Coronavirus 2: NEGATIVE

## 2019-11-21 ENCOUNTER — Encounter: Payer: Self-pay | Admitting: Surgery

## 2019-11-22 ENCOUNTER — Ambulatory Visit: Payer: Medicare HMO | Admitting: Registered Nurse

## 2019-11-22 ENCOUNTER — Encounter: Payer: Self-pay | Admitting: Surgery

## 2019-11-22 ENCOUNTER — Encounter
Admission: RE | Disposition: A | Discharge status: Home / Self Care | Payer: Self-pay | Source: Home / Self Care | Attending: Surgery

## 2019-11-22 ENCOUNTER — Ambulatory Visit
Admission: RE | Admit: 2019-11-22 | Discharge: 2019-11-22 | Disposition: A | Discharge status: Home / Self Care | Payer: Medicare HMO | Attending: Surgery | Admitting: Surgery

## 2019-11-22 DIAGNOSIS — Z1211 Encounter for screening for malignant neoplasm of colon: Secondary | ICD-10-CM | POA: Diagnosis present

## 2019-11-22 DIAGNOSIS — I10 Essential (primary) hypertension: Secondary | ICD-10-CM | POA: Insufficient documentation

## 2019-11-22 DIAGNOSIS — K640 First degree hemorrhoids: Secondary | ICD-10-CM | POA: Diagnosis not present

## 2019-11-22 DIAGNOSIS — Z79899 Other long term (current) drug therapy: Secondary | ICD-10-CM | POA: Diagnosis not present

## 2019-11-22 DIAGNOSIS — L409 Psoriasis, unspecified: Secondary | ICD-10-CM | POA: Insufficient documentation

## 2019-11-22 DIAGNOSIS — K644 Residual hemorrhoidal skin tags: Secondary | ICD-10-CM | POA: Diagnosis not present

## 2019-11-22 DIAGNOSIS — Z800 Family history of malignant neoplasm of digestive organs: Secondary | ICD-10-CM | POA: Diagnosis not present

## 2019-11-22 DIAGNOSIS — N400 Enlarged prostate without lower urinary tract symptoms: Secondary | ICD-10-CM | POA: Diagnosis not present

## 2019-11-22 DIAGNOSIS — Z7982 Long term (current) use of aspirin: Secondary | ICD-10-CM | POA: Insufficient documentation

## 2019-11-22 HISTORY — PX: COLONOSCOPY WITH PROPOFOL: SHX5780

## 2019-11-22 HISTORY — DX: Syncope and collapse: R55

## 2019-11-22 SURGERY — COLONOSCOPY WITH PROPOFOL
Anesthesia: General

## 2019-11-22 MED ORDER — PROPOFOL 10 MG/ML IV BOLUS
INTRAVENOUS | Status: DC | PRN
  Administered 2019-11-22: 100 mg via INTRAVENOUS

## 2019-11-22 MED ORDER — LIDOCAINE HCL (CARDIAC) PF 100 MG/5ML IV SOSY
PREFILLED_SYRINGE | INTRAVENOUS | Status: DC | PRN
  Administered 2019-11-22: 40 mg via INTRAVENOUS

## 2019-11-22 MED ORDER — SODIUM CHLORIDE 0.9 % IV SOLN: INTRAVENOUS | Status: DC

## 2019-11-22 MED ORDER — PROPOFOL 500 MG/50ML IV EMUL
INTRAVENOUS | Status: DC | PRN
  Administered 2019-11-22: 150 ug/kg/min via INTRAVENOUS

## 2019-11-22 NOTE — Anesthesia Preprocedure Evaluation (Addendum)
Anesthesia Evaluation  Patient identified by MRN, date of birth, ID band Patient awake    Reviewed: Allergy & Precautions, H&P , NPO status , reviewed documented beta blocker date and time   Airway Mallampati: II  TM Distance: >3 FB Neck ROM: full    Dental  (+) Teeth Intact, Caps   Pulmonary    Pulmonary exam normal        Cardiovascular hypertension, Normal cardiovascular exam     Neuro/Psych Vertigo & Syncope - neg cardiac W/U. 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 "Thinks my               blood pressure was dropping" No date: Vertigo     Comment:  major episode 07/10/17.  other minor since.  Currently               having some issues.  Past Surgical History: 07/26/2018: CATARACT EXTRACTION W/PHACO; Right     Comment:  Procedure: CATARACT EXTRACTION PHACO AND INTRAOCULAR               LENS PLACEMENT (IOC)  RIGHT;  Surgeon: Lockie Mola, MD;  Location[facility];  Service:               Ophthalmology;  Laterality: Right; 09/12/2018: CATARACT EXTRACTION W/PHACO; Left     Comment:  Procedure: CATARACT EXTRACTION PHACO AND INTRAOCULAR               LENS PLACEMENT (IOC) LEFT;  Surgeon: Lockie Mola, MD;  Location: [facility];  Service:               Ophthalmology;  Laterality: Left; 05/19/2015: COLONOSCOPY; N/A     Comment:  Procedure: COLONOSCOPY;  Surgeon: Wallace Cullens, MD;                Location: [facility] END[facility]COPY;  Service: Gastroenterology;                Laterality: N/A; No date: HERNIA REPAIR No date: KNEE SURGERY     Reproductive/Obstetrics                           Anesthesia Physical Anesthesia Plan  ASA: II  Anesthesia Plan: General   Post-op Pain Management:    Induction: Intravenous  PONV Risk Score and Plan: Treatment may vary due to age or medical condition and TIVA  Airway Management Planned: Nasal Cannula and Natural Airway  Additional Equipment:   Intra-op Plan:   Post-operative Plan:   Informed Consent: I have reviewed the patients History and Physical, chart, labs and discussed the procedure  including the risks, benefits and alternatives for the proposed anesthesia with the patient or authorized representative who has indicated his/her understanding and acceptance.     Dental Advisory Given  Plan Discussed with: CRNA  Anesthesia Plan Comments:         Anesthesia Quick Evaluation

## 2019-11-22 NOTE — Op Note (Signed)
[facility] Gastroenterology Patient Name: Camila Norville Procedure Date: 11/22/2019 11:56 AM MRN: [id number] Account #: [id number] Date of Birth: 03-Aug-1940 Admit Type: Outpatient Age: 79 Room: [facility] ENDO ROOM 1 Gender: Male Note Status: Finalized Procedure:             Colonoscopy Indications:           Screening in patient at increased risk: Family history                         of 1st-degree relative with colorectal cancer Providers:             Eliseo Squires MD, MD Referring MD:          Sofie Hartigan (Referring MD) Medicines:             Propofol per Anesthesia Complications:         No immediate complications. Procedure:             Pre-Anesthesia Assessment:                        - After reviewing the risks and benefits, the patient                         was deemed in satisfactory condition to undergo the                         procedure in an ambulatory setting.                        After obtaining informed consent, the colonoscope was                         passed under direct vision. Throughout the procedure,                         the patient's blood pressure, pulse, and oxygen                         saturations were monitored continuously. The                         Colonoscope was introduced through the anus and                         advanced to the the cecum, identified by the ileocecal                         valve. The colonoscopy was performed without                         difficulty. The patient tolerated the procedure well.                         The quality of the bowel preparation was good. Findings:      Skin tags were found on perianal exam.      Non-bleeding internal hemorrhoids were found during retroflexion. The       hemorrhoids were Grade I (internal hemorrhoids that do not prolapse).      The exam was otherwise without  abnormality. Impression:            - Perianal skin tags found on perianal exam.  - Non-bleeding internal hemorrhoids.                        - The examination was otherwise normal.                        - No specimens collected. Recommendation:        - Discharge patient to home.                        - Resume previous diet.                        - Written discharge instructions were provided to the                         patient. Procedure Code(s):     --- Professional ---                        Q3335, Colorectal cancer screening; colonoscopy on                         individual at high risk Diagnosis Code(s):     --- Professional ---                        Z80.0, Family history of malignant neoplasm of                         digestive organs                        K64.0, First degree hemorrhoids                        K64.4, Residual hemorrhoidal skin tags CPT copyright 2019 [employer]. All rights reserved. The codes documented in this report are preliminary and upon coder review may  be revised to meet current compliance requirements. Dr. Harrie Foreman, MD Arville Go MD, MD 11/22/2019 12:25:27 PM This report has been signed electronically. Number of Addenda: 0 Note Initiated On: 11/22/2019 11:56 AM Scope Withdrawal Time: 0 hours 10 minutes 28 seconds  Total Procedure Duration: 0 hours 15 minutes 12 seconds  Estimated Blood Loss:  Estimated blood loss: none.      [facility]

## 2019-11-22 NOTE — Transfer of Care (Signed)
Immediate Anesthesia Transfer of Care Note  Patient: [name]  Procedure(s) Performed: Procedure(s): COLONOSCOPY WITH PROPOFOL (N/A)  Patient Location: PACU and Endoscopy Unit  Anesthesia Type:General  Level of Consciousness: sedated  Airway & Oxygen Therapy: Patient Spontanous Breathing and Patient connected to nasal cannula oxygen  Post-op Assessment: Report given to RN and Post -op Vital signs reviewed and stable  Post vital signs: Reviewed and stable  Last Vitals:  Vitals:   11/22/19 1138 11/22/19 1227  BP: (!) 166/87 121/75  Pulse: 61 60  Resp: 16 17  Temp: 36.4 C (!) 36.1 C  SpO2: 98% 96%    Complications: No apparent anesthesia complications

## 2019-11-22 NOTE — Anesthesia Procedure Notes (Signed)
Date/Time: 11/22/2019 12:05 PM Performed by: Stormy Fabian, CRNA Pre-anesthesia Checklist: Patient identified, Emergency Drugs available, Suction available and Patient being monitored Patient Re-evaluated:Patient Re-evaluated prior to induction Oxygen Delivery Method: Nasal cannula Induction Type: IV induction Dental Injury: Teeth and Oropharynx as per pre-operative assessment  Comments: Nasal cannula with etCO2 monitoring

## 2019-11-22 NOTE — Anesthesia Postprocedure Evaluation (Signed)
Anesthesia Post Note  Patient: Earl Holmes  Procedure(s) Performed: COLONOSCOPY WITH PROPOFOL (N/A )  Patient location during evaluation: Endoscopy Anesthesia Type: General Level of consciousness: awake and alert Pain management: pain level controlled Vital Signs Assessment: post-procedure vital signs reviewed and stable Respiratory status: spontaneous breathing, nonlabored ventilation and respiratory function stable Cardiovascular status: blood pressure returned to baseline and stable Postop Assessment: no apparent nausea or vomiting Anesthetic complications: no     Last Vitals:  Vitals:   11/22/19 1247 11/22/19 1257  BP: (!) 149/85 139/83  Pulse: 71 62  Resp: 14 15  Temp:    SpO2: 98% 97%    Last Pain:  Vitals:   11/22/19 1257  TempSrc:   PainSc: 0-No pain                 Christia Reading

## 2019-11-22 NOTE — H&P (Signed)
Subjective:  CC: hemorrhoids and screening colonscopy  HPI: Earl Holmes is a 79 y.o. male who was referred by Dr. Carlis Abbott for evaluation of hemorrhoids. Symptoms were first noted several years ago. Symptoms did not start at work. Pain is intermittent, non-radiating. he has some rectal bleeding occurring occasionally on toilet paper. Bleeding is described as just notes blood on tissue paper. Patient is having rectal itching. Exacerbated by hard stool. Alleviated by nothing specific, including OTC. Associated with nothing specific.   Toilet habits: Daily, soft BMs, no straining.  Patient does not have a personal history of colon cancer. Patient does not have a personal history of IBD. Patient does not have to history of polyps. Last colonoscopy in 2016, recommended PRN due to advanced age.  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 He declined further confirmatory testing at that time, but returns to request another colonoscopy due to this recent finding as well brother with history or colon cancer, s/p resection.  Past Medical History: has a past medical history of BPH (benign prostatic hypertrophy), GERD (gastroesophageal reflux disease), Hyperlipidemia, Hypertension, and Other symptoms involving digestive system(787.99).  Past Surgical History: has a past surgical history that includes Joint replacement (2003); Hernia repair; Colonoscopy (12/24/2009); and Colonoscopy (05/19/2015).  Family History: family history includes Bone cancer in his brother; Breast cancer in his sister; Colon cancer in his brother; Colon polyps in his brother and sister; Coronary Artery Disease (Blocked arteries around heart) in his father; Stroke in his mother.  Social History: reports that he has never smoked. He has never used smokeless tobacco. He reports that he does not drink alcohol. No history on file for drug use.  Current Medications: has a  current medication list which includes the following prescription(s): amlodipine, ascorbic acid (vitamin c), aspirin, cholecalciferol (vitamin d3), eplerenone, finasteride, halobetasol, hydrochlorothiazide, losartan, mv,ca,min-iron-fa-lycopene, omega-3 fatty acids/fish oil, triamcinolone, and vitamin e (dl, acetate).  Allergies:  Allergies  Allergen Reactions  . Augmentin [Amoxicillin-Pot Clavulanate] Other (See Comments)  BOTH BROTHER & SISTER HAVE HAD SEVERE MYALGIAS WHEN TAKING AUGMENTIN OR PCN  . Codeine Sulfate Rash  . Penicillin V Potassium Other (See Comments)  BOTH BROTHER & SISTER HAVE HAD SEVERE MYALGIAS WHEN TAKING AUGMENTIN OR PCN   ROS: A comprehensive review of systems was asked and was negative. except noted in HPI   Objective:    BP 149/77  Pulse 63  Ht 185.4 cm (6\' 1" )  Wt (!) 113.4 kg (250 lb)  BMI 32.98 kg/m   Constitutional : alert, appears stated age, cooperative and no distress  Throat: no asymmetry, masses, or scars  Respiratory: clear to auscultation bilaterally  Cardiovascular: regular rate and rhythm, S1, S2 normal, no murmur, click, rub or gallop  Gastrointestinal: soft, non-tender; bowel sounds normal; no masses, no organomegaly.  Musculoskeletal: Steady gait and movement  Skin: Cool and moist, no visible surgical scars  Psychiatric: Normal affect, non-agitated, not confused  Genital/Rectal: Deferred for today.   LABS:  -n/a   RADS: N/a  Assessment:   external and grade one internal hemorrhoid . Still not enough symptoms to warrant proceeding with open hemmorhoidectomy with associated risks in my opinion.  We can proceed with screening colonoscopy as requested.  Plan:   1. Discussed risks/benefits/alternatives to Colonoscopy. Risks include but not limited to bleeding, perforation, inability to complete due to poor prep. Benefits include diagnostic and potential curative intent. Alternatives includes continued observation. He verbalized  understanding still wishes to  proceed with colonoscopy. This will be scheduled either at [facility] or ambulatory surgery center.

## 2019-11-23 ENCOUNTER — Encounter: Payer: Self-pay | Admitting: *Deleted

## 2020-07-25 DIAGNOSIS — M1712 Unilateral primary osteoarthritis, left knee: Secondary | ICD-10-CM | POA: Insufficient documentation

## 2020-07-31 ENCOUNTER — Other Ambulatory Visit: Payer: Self-pay

## 2020-07-31 ENCOUNTER — Encounter
Admission: RE | Admit: 2020-07-31 | Discharge: 2020-07-31 | Disposition: A | Discharge status: Home / Self Care | Payer: Medicare HMO | Source: Ambulatory Visit | Attending: Orthopedic Surgery | Admitting: Orthopedic Surgery

## 2020-07-31 DIAGNOSIS — Z01818 Encounter for other preprocedural examination: Secondary | ICD-10-CM | POA: Diagnosis not present

## 2020-07-31 DIAGNOSIS — Z20822 Contact with and (suspected) exposure to covid-19: Secondary | ICD-10-CM | POA: Diagnosis not present

## 2020-07-31 HISTORY — DX: Unilateral primary osteoarthritis, left knee: M17.12

## 2020-07-31 LAB — CBC
HCT: 44.4 % (ref 39.0–52.0)
Hemoglobin: 16.2 g/dL (ref 13.0–17.0)
MCH: 33.1 pg (ref 26.0–34.0)
MCHC: 36.5 g/dL — ABNORMAL HIGH (ref 30.0–36.0)
MCV: 90.6 fL (ref 80.0–100.0)
Platelets: 202 10*3/uL (ref 150–400)
RBC: 4.9 MIL/uL (ref 4.22–5.81)
RDW: 13.2 % (ref 11.5–15.5)
WBC: 5.2 10*3/uL (ref 4.0–10.5)
nRBC: 0 % (ref 0.0–0.2)

## 2020-07-31 LAB — URINALYSIS, ROUTINE W REFLEX MICROSCOPIC
Bilirubin Urine: NEGATIVE
Glucose, UA: NEGATIVE mg/dL
Hgb urine dipstick: NEGATIVE
Ketones, ur: NEGATIVE mg/dL
Leukocytes,Ua: NEGATIVE
Nitrite: NEGATIVE
Protein, ur: NEGATIVE mg/dL
Specific Gravity, Urine: 1.004 — ABNORMAL LOW (ref 1.005–1.030)
pH: 7 (ref 5.0–8.0)

## 2020-07-31 LAB — TYPE AND SCREEN
ABO/RH(D): O POS
Antibody Screen: NEGATIVE

## 2020-07-31 LAB — COMPREHENSIVE METABOLIC PANEL
ALT: 21 U/L (ref 0–44)
AST: 24 U/L (ref 15–41)
Albumin: 4.3 g/dL (ref 3.5–5.0)
Alkaline Phosphatase: 49 U/L (ref 38–126)
Anion gap: 10 (ref 5–15)
BUN: 15 mg/dL (ref 8–23)
CO2: 27 mmol/L (ref 22–32)
Calcium: 9.5 mg/dL (ref 8.9–10.3)
Chloride: 101 mmol/L (ref 98–111)
Creatinine, Ser: 0.77 mg/dL (ref 0.61–1.24)
GFR, Estimated: >60 mL/min (ref >60)
Glucose, Bld: 94 mg/dL (ref 70–99)
Potassium: 3.4 mmol/L — ABNORMAL LOW (ref 3.5–5.1)
Sodium: 138 mmol/L (ref 135–145)
Total Bilirubin: 0.9 mg/dL (ref 0.3–1.2)
Total Protein: 7 g/dL (ref 6.5–8.1)

## 2020-07-31 LAB — SURGICAL PCR SCREEN
MRSA, PCR: NEGATIVE
Staphylococcus aureus: NEGATIVE

## 2020-07-31 LAB — C-REACTIVE PROTEIN: CRP: <0.5 mg/dL (ref <1.0)

## 2020-07-31 LAB — SEDIMENTATION RATE: Sed Rate: 3 mm/hr (ref 0–20)

## 2020-07-31 LAB — PROTIME-INR
INR: 1 (ref 0.8–1.2)
Prothrombin Time: 13 seconds (ref 11.4–15.2)

## 2020-07-31 LAB — APTT: aPTT: 28 seconds (ref 24–36)

## 2020-07-31 NOTE — Patient Instructions (Addendum)
Your procedure is scheduled on: Monday, January 31 Report to the Registration Desk on the 1st floor of the [facility] To find out your arrival time, please call [phone] between 1PM - 3PM on: Friday, January 28  REMEMBER: Instructions that are not followed completely may result in serious medical risk, up to and including death; or upon the discretion of your surgeon and anesthesiologist your surgery may need to be rescheduled.  Do not eat food after midnight the night before surgery.  No gum chewing, lozengers or hard candies.  You may however, drink CLEAR liquids up to 2 hours before you are scheduled to arrive for your surgery. Do not drink anything within 2 hours of your scheduled arrival time.  Clear liquids include: - water  - apple juice without pulp - gatorade (not RED, PURPLE, OR BLUE) - black coffee or tea (Do NOT add milk or creamers to the coffee or tea) Do NOT drink anything that is not on this list.  In addition, your doctor has ordered for you to drink the provided  Ensure Pre-Surgery Clear Carbohydrate Drink  Drinking this carbohydrate drink up to two hours before surgery helps to reduce insulin resistance and improve patient outcomes. Please complete drinking 2 hours prior to scheduled arrival time.  TAKE THESE MEDICATIONS THE MORNING OF SURGERY WITH A SIP OF WATER:  1.  Amlodipine 2.  Finasteride  One week prior to surgery: starting now Stop ASPIRIN,  Anti-inflammatories (NSAIDS) such as Advil, Aleve, Ibuprofen, Motrin, Naproxen, Naprosyn and Aspirin based products such as Excedrin, Goodys Powder, BC Powder. Stop ANY OVER THE COUNTER supplements until after surgery. Stop Fish oil, Vitamin C, Vitamin E (However, you may continue taking Vitamin D, Vitamin B, and multivitamin up until the day before surgery.)  On the morning of surgery brush your teeth with toothpaste and water, you may rinse your mouth with mouthwash if you wish. Do not swallow any  toothpaste or mouthwash.  Do not wear jewelry.  Do not wear lotions, powders, or perfumes.   Do not shave body from the neck down 48 hours prior to surgery just in case you cut yourself which could leave a site for infection.  Also, freshly shaved skin may become irritated if using the CHG soap.  Do not bring valuables to the hospital. [facility] is not responsible for any missing/lost belongings or valuables.   Use CHG Soap as directed on instruction sheet.  Notify your doctor if there is any change in your medical condition (cold, fever, infection).  Wear comfortable clothing (specific to your surgery type) to the hospital.  Plan for stool softeners for home use; pain medications have a tendency to cause constipation. You can also help prevent constipation by eating foods high in fiber such as fruits and vegetables and drinking plenty of fluids as your diet allows.  After surgery, you can help prevent lung complications by doing breathing exercises.  Take deep breaths and cough every 1-2 hours. Your doctor may order a device called an Incentive Spirometer to help you take deep breaths.  If you are being discharged the day of surgery, you will not be allowed to drive home. You will need a responsible adult (18 years or older) to drive you home and stay with you that night.   If you are taking public transportation, you will need to have a responsible adult (18 years or older) with you. Please confirm with your physician that it is acceptable to use public transportation.  Please call the Pre-admissions Testing Dept. at [phone] if you have any questions about these instructions.  Visitation Policy:  Patients undergoing a surgery or procedure may have one family member or support person with them as long as that person is not COVID-19 positive or experiencing its symptoms.  That person may remain in the waiting area during the procedure.

## 2020-08-01 ENCOUNTER — Other Ambulatory Visit
Admission: RE | Admit: 2020-08-01 | Discharge: 2020-08-01 | Disposition: A | Discharge status: Home / Self Care | Payer: Medicare HMO | Source: Ambulatory Visit | Attending: Orthopedic Surgery | Admitting: Orthopedic Surgery

## 2020-08-01 DIAGNOSIS — Z01818 Encounter for other preprocedural examination: Secondary | ICD-10-CM | POA: Diagnosis not present

## 2020-08-01 LAB — URINE CULTURE
Culture: NO GROWTH
Special Requests: NORMAL

## 2020-08-01 LAB — SARS CORONAVIRUS 2 (TAT 6-24 HRS): SARS Coronavirus 2: NEGATIVE

## 2020-08-03 ENCOUNTER — Encounter: Payer: Self-pay | Admitting: Orthopedic Surgery

## 2020-08-03 NOTE — H&P (Signed)
ORTHOPAEDIC HISTORY & PHYSICAL Britnee Mcdevitt, Adelina Mings., MD - 07/22/2020 10:00 AM EST Chief Complaint: Chief Complaint  Patient presents with  . Knee Pain  Left knee osteoarthritis   Reason for Visit: The patient is a 80 y.o. male who presents today for reevaluation of his left knee. He reports a long history of progressive left knee pain. He localizes most of the pain along the medial aspect of the knee. He reports some swelling, some locking, and some giving way of the knee. The pain is aggravated by any weight bearing. The knee pain limits the patient's ability to ambulate long distances. The patient has not appreciated any significant improvement despite activity modification and OTC medications. He is not using any ambulatory aids. The patient states that the knee pain has progressed to the point that it is significantly interfering with his activities of daily living.  Medications: Current Outpatient Medications  Medication Sig Dispense Refill  . ascorbic acid (VITAMIN C) 1000 MG tablet Take 1,000 mg by mouth once daily.  Marland Kitchen aspirin 81 MG EC tablet Take 81 mg by mouth once daily  . cholecalciferol, vitamin D3, 1,250 mcg (50,000 unit) Tab Take 1 tablet by mouth once daily  . eplerenone (INSPRA) 25 MG tablet Take 1 tablet (25 mg total) by mouth once daily 90 tablet 3  . mv,Ca,min-iron-FA-lycopene (CENTRUM ULTRA MEN'S) 8 mg iron- 200 mcg-600 mcg Tab Take 1 tablet by mouth once daily  . omega-3 fatty acids/fish oil 340-1,000 mg capsule Take 1 capsule by mouth 2 (two) times daily.  Marland Kitchen triamcinolone 0.1 % cream Apply to back, trunk, and legs up to twice daily as needed for itch.  Marland Kitchen VITAMIN E, DL,TOCOPHERYL ACET, (VITAMIN E, DL, ACETATE,) 7,628 unit Cap Take 1,000 Units by mouth once daily  . amLODIPine (NORVASC) 10 MG tablet TAKE 1 TABLET BY MOUTH EVERY DAY 90 tablet 3  . finasteride (PROSCAR) 5 mg tablet TAKE 1 TABLET BY MOUTH EVERY DAY 90 tablet 3  . hydroCHLOROthiazide (HYDRODIURIL) 25 MG  tablet TAKE 1 TABLET BY MOUTH EVERY DAY 90 tablet 3  . losartan (COZAAR) 25 MG tablet TAKE 2 TABLETS BY MOUTH ONCE DAILY. 180 tablet 3   No current facility-administered medications for this visit.   Allergies: Allergies  Allergen Reactions  . Augmentin [Amoxicillin-Pot Clavulanate] Other (See Comments)  BOTH BROTHER & SISTER HAVE HAD SEVERE MYALGIAS WHEN TAKING AUGMENTIN OR PCN  . Codeine Sulfate Rash  . Penicillin V Potassium Other (See Comments)  BOTH BROTHER & SISTER HAVE HAD SEVERE MYALGIAS WHEN TAKING AUGMENTIN OR PCN   Past Medical History: Past Medical History:  Diagnosis Date  . BPH (benign prostatic hypertrophy)  . GERD (gastroesophageal reflux disease)  . Hyperlipidemia  . Hypertension  . Other symptoms involving digestive system(787.99)   Past Surgical History: Past Surgical History:  Procedure Laterality Date  . COLONOSCOPY 12/24/2009  12/24/2009, 01/04/2006, 12/19/2000, 11/10/1998 all Normal  . COLONOSCOPY 05/19/2015  Entire examined colon is normal/FHx of colon cancer-Brother/FHx colon polyps-Sister/No Repeat/PYO  . HERNIA REPAIR  umbilical  . JOINT REPLACEMENT 2003  partial knee replacement   Social History: Social History   Socioeconomic History  . Marital status: Single  Spouse name: Not on file  . Number of children: Not on file  . Years of education: Not on file  . Highest education level: Not on file  Occupational History  . Not on file  Tobacco Use  . Smoking status: Never Smoker  . Smokeless tobacco: Never Used  Vaping Use  .  Vaping Use: Never used  Substance and Sexual Activity  . Alcohol use: No  . Drug use: Not on file  . Sexual activity: Not on file  Other Topics Concern  . Not on file  Social History Narrative  SH  Worked in Adult nurse [employer]  Single - MSM  No kids  lots of nieces and nephews -  TObacco - none  etoh - none   Social Determinants of Corporate investment banker Strain: Not on file  Food Insecurity: Not  on file  Transportation Needs: Not on file  Physical Activity: Not on file  Stress: Not on file  Social Connections: Not on file  Housing Stability: Not on file   Family History: Family History  Problem Relation Age of Onset  . Colon cancer Brother  . Colon polyps Brother  . Colon polyps Sister  . Breast cancer Sister  . Bone cancer Brother  . Stroke Mother  . Coronary Artery Disease (Blocked arteries around heart) Father  at an early age   Review of Systems: A comprehensive 14 point ROS was performed, reviewed, and the pertinent orthopaedic findings are documented in the HPI.  Exam BP 142/76  Temp 36.6 C (97.9 F)  Ht 185.4 cm (6\' 1" )  Wt 94.1 kg (207 lb 6.4 oz)  BMI 27.36 kg/m   General:  Well-developed, well-nourished male seen in no acute distress.  Antalgic gait. Varus thrust to the left knee.  HEENT:  Atraumatic, normocephalic. Pupils are equal and reactive to light. Extraocular motion is intact. Sclera are clear. Oropharynx is clear with moist mucosa.  Neck:  Supple, nontender, and with good ROM. No thyromegaly, adenopathy, JVD, or carotid bruits.  Lungs:  Clear to auscultation bilaterally.  Cardiovascular:  Regular rate and rhythm. Normal S1, S2. No murmur . No appreciable gallops or rubs. Peripheral pulses are palpable. No lower extremity edema. Homan`s test is negative.  Abdomen:  Soft, nontender, nondistended. Bowel sounds are present.  Extremities: Good strength, stability, and range of motion of the upper extremities. Good range of motion of the hips and ankles.  Left Knee: Soft tissue swelling: mild Effusion: none Erythema: none Crepitance: mild Tenderness: medial Alignment: relative varus Mediolateral laxity: medial pseudolaxity Posterior sag: negative Patellar tracking: Good tracking without evidence of subluxation or tilt Atrophy: No significant atrophy.  Quadriceps tone was fair to good. Range of motion: 0/2/121  degrees  Neurologic:  Awake, alert, and oriented.  Sensory function is intact to pinprick and light touch.  Motor strength is judged to be 5/5.  Motor coordination is within normal limits.  No apparent clonus. No tremor.   X-rays: I ordered and interpreted standing AP, lateral, and sunrise radiographs of the left knee that were obtained in the office today. There is severe narrowing of the medial cartilage space with bone-on-bone articulation and associated varus alignment. Osteophyte formation is noted. Subchondral sclerosis is noted. No evidence of fracture or dislocation.   Impression: Degenerative arthrosis of the left knee  Plan:  The findings were discussed in detail with the patient. The patient was given informational material on total knee replacement. Conservative treatment options were reviewed with the patient. We discussed the risks and benefits of surgical intervention. The usual perioperative course was also discussed in detail. The patient expressed understanding of the risks and benefits of surgical intervention and would like to proceed with plans for left total knee arthroplasty.  The patient lives alone but states that he has neighbors that will be available  to help him at home. He would like to consider same day surgery.  MEDICAL CLEARANCE: Per anesthesiology. ACTIVITY: As tolerated. WORK STATUS: Not applicable. THERAPY: Preoperative physical therapy evaluation. MEDICATIONS: Requested Prescriptions   No prescriptions requested or ordered in this encounter   FOLLOW-UP: Return for preop History & Physical pending surgery date.  Karey P. Angie Fava., M.D.  This note was generated in part with voice recognition software and I apologize for any typographical errors that were not detected and corrected.   Electronically signed by Shari Heritage., MD at 07/31/2020 11:04 AM EST

## 2020-08-04 ENCOUNTER — Ambulatory Visit: Payer: Medicare HMO

## 2020-08-04 ENCOUNTER — Other Ambulatory Visit: Payer: Self-pay

## 2020-08-04 ENCOUNTER — Ambulatory Visit: Payer: Medicare HMO | Admitting: Anesthesiology

## 2020-08-04 ENCOUNTER — Ambulatory Visit
Admission: RE | Admit: 2020-08-04 | Discharge: 2020-08-04 | Disposition: A | Discharge status: Home / Self Care | Payer: Medicare HMO | Attending: Orthopedic Surgery | Admitting: Orthopedic Surgery

## 2020-08-04 ENCOUNTER — Encounter: Payer: Self-pay | Admitting: Orthopedic Surgery

## 2020-08-04 ENCOUNTER — Encounter
Admission: RE | Disposition: A | Discharge status: Home / Self Care | Payer: Self-pay | Source: Home / Self Care | Attending: Orthopedic Surgery

## 2020-08-04 DIAGNOSIS — Z96651 Presence of right artificial knee joint: Secondary | ICD-10-CM | POA: Diagnosis not present

## 2020-08-04 DIAGNOSIS — Z79899 Other long term (current) drug therapy: Secondary | ICD-10-CM | POA: Diagnosis not present

## 2020-08-04 DIAGNOSIS — Z803 Family history of malignant neoplasm of breast: Secondary | ICD-10-CM | POA: Insufficient documentation

## 2020-08-04 DIAGNOSIS — Z800 Family history of malignant neoplasm of digestive organs: Secondary | ICD-10-CM | POA: Diagnosis not present

## 2020-08-04 DIAGNOSIS — Z823 Family history of stroke: Secondary | ICD-10-CM | POA: Insufficient documentation

## 2020-08-04 DIAGNOSIS — Z961 Presence of intraocular lens: Secondary | ICD-10-CM | POA: Diagnosis not present

## 2020-08-04 DIAGNOSIS — M1712 Unilateral primary osteoarthritis, left knee: Secondary | ICD-10-CM | POA: Diagnosis present

## 2020-08-04 DIAGNOSIS — Z96659 Presence of unspecified artificial knee joint: Secondary | ICD-10-CM

## 2020-08-04 DIAGNOSIS — Z96652 Presence of left artificial knee joint: Secondary | ICD-10-CM

## 2020-08-04 DIAGNOSIS — Z8249 Family history of ischemic heart disease and other diseases of the circulatory system: Secondary | ICD-10-CM | POA: Insufficient documentation

## 2020-08-04 DIAGNOSIS — Z808 Family history of malignant neoplasm of other organs or systems: Secondary | ICD-10-CM | POA: Insufficient documentation

## 2020-08-04 DIAGNOSIS — Z885 Allergy status to narcotic agent status: Secondary | ICD-10-CM | POA: Diagnosis not present

## 2020-08-04 DIAGNOSIS — Z881 Allergy status to other antibiotic agents status: Secondary | ICD-10-CM | POA: Diagnosis not present

## 2020-08-04 DIAGNOSIS — M25762 Osteophyte, left knee: Secondary | ICD-10-CM | POA: Diagnosis not present

## 2020-08-04 DIAGNOSIS — M2392 Unspecified internal derangement of left knee: Secondary | ICD-10-CM | POA: Insufficient documentation

## 2020-08-04 DIAGNOSIS — Z9841 Cataract extraction status, right eye: Secondary | ICD-10-CM | POA: Diagnosis not present

## 2020-08-04 DIAGNOSIS — Z7982 Long term (current) use of aspirin: Secondary | ICD-10-CM | POA: Diagnosis not present

## 2020-08-04 HISTORY — PX: KNEE ARTHROPLASTY: SHX992

## 2020-08-04 HISTORY — PX: APPLICATION OF WOUND VAC: SHX5189

## 2020-08-04 LAB — ABO/RH: ABO/RH(D): O POS

## 2020-08-04 LAB — IGE: IgE (Immunoglobulin E), Serum: 38 IU/mL (ref 6–495)

## 2020-08-04 SURGERY — ARTHROPLASTY, KNEE, TOTAL, USING IMAGELESS COMPUTER-ASSISTED NAVIGATION
Anesthesia: Spinal | Site: Knee | Laterality: Left

## 2020-08-04 MED ORDER — BUPIVACAINE HCL (PF) 0.25 % IJ SOLN
INTRAMUSCULAR | Status: AC
  Filled 2020-08-04: qty 60

## 2020-08-04 MED ORDER — DIPHENHYDRAMINE HCL 12.5 MG/5ML PO ELIX
12.5000 mg | ORAL_SOLUTION | ORAL | Status: DC | PRN
  Filled 2020-08-04: qty 10

## 2020-08-04 MED ORDER — CHLORHEXIDINE GLUCONATE 0.12 % MT SOLN
OROMUCOSAL | Status: AC
  Administered 2020-08-04: 15 mL via OROMUCOSAL
  Filled 2020-08-04: qty 15

## 2020-08-04 MED ORDER — BUPIVACAINE HCL (PF) 0.5 % IJ SOLN
INTRAMUSCULAR | Status: DC | PRN
  Administered 2020-08-04: 3 mL

## 2020-08-04 MED ORDER — TRANEXAMIC ACID-NACL 1000-0.7 MG/100ML-% IV SOLN
INTRAVENOUS | Status: AC
  Filled 2020-08-04: qty 100

## 2020-08-04 MED ORDER — OXYCODONE HCL 5 MG PO TABS: 5.0000 mg | ORAL_TABLET | ORAL | 0 refills | Status: DC | PRN

## 2020-08-04 MED ORDER — TRANEXAMIC ACID-NACL 1000-0.7 MG/100ML-% IV SOLN: 1000.0000 mg | Freq: Once | INTRAVENOUS | Status: AC

## 2020-08-04 MED ORDER — SURGIPHOR WOUND IRRIGATION SYSTEM - OPTIME
TOPICAL | Status: DC | PRN
  Administered 2020-08-04: 450 mL via TOPICAL

## 2020-08-04 MED ORDER — PROPOFOL 10 MG/ML IV BOLUS
INTRAVENOUS | Status: DC | PRN
  Administered 2020-08-04: 20 mg via INTRAVENOUS
  Administered 2020-08-04: 25 mg via INTRAVENOUS

## 2020-08-04 MED ORDER — GABAPENTIN 300 MG PO CAPS
ORAL_CAPSULE | ORAL | Status: AC
  Administered 2020-08-04: 300 mg via ORAL
  Filled 2020-08-04: qty 1

## 2020-08-04 MED ORDER — PROPOFOL 500 MG/50ML IV EMUL
INTRAVENOUS | Status: AC
  Filled 2020-08-04: qty 50

## 2020-08-04 MED ORDER — OXYCODONE HCL 5 MG/5ML PO SOLN: 5.0000 mg | Freq: Once | ORAL | Status: DC | PRN

## 2020-08-04 MED ORDER — NEOMYCIN-POLYMYXIN B GU 40-200000 IR SOLN
Status: DC | PRN
  Administered 2020-08-04: 16 mL

## 2020-08-04 MED ORDER — SODIUM CHLORIDE 0.9 % IV SOLN
INTRAVENOUS | Status: DC | PRN
  Administered 2020-08-04: 20 ug/min via INTRAVENOUS

## 2020-08-04 MED ORDER — BUPIVACAINE HCL (PF) 0.25 % IJ SOLN
INTRAMUSCULAR | Status: DC | PRN
  Administered 2020-08-04: 60 mL

## 2020-08-04 MED ORDER — CEFAZOLIN SODIUM-DEXTROSE 2-4 GM/100ML-% IV SOLN
INTRAVENOUS | Status: AC
  Administered 2020-08-04: 2 g via INTRAVENOUS
  Filled 2020-08-04: qty 100

## 2020-08-04 MED ORDER — PROPOFOL 500 MG/50ML IV EMUL
INTRAVENOUS | Status: DC | PRN
  Administered 2020-08-04: 50 ug/kg/min via INTRAVENOUS

## 2020-08-04 MED ORDER — GABAPENTIN 300 MG PO CAPS: 300.0000 mg | ORAL_CAPSULE | Freq: Once | ORAL | Status: AC

## 2020-08-04 MED ORDER — FAMOTIDINE 20 MG PO TABS: 20.0000 mg | ORAL_TABLET | Freq: Once | ORAL | Status: AC

## 2020-08-04 MED ORDER — ONDANSETRON HCL 4 MG/2ML IJ SOLN: 4.0000 mg | Freq: Once | INTRAMUSCULAR | Status: DC | PRN

## 2020-08-04 MED ORDER — CEFAZOLIN SODIUM-DEXTROSE 2-4 GM/100ML-% IV SOLN
2.0000 g | INTRAVENOUS | Status: AC
  Administered 2020-08-04: 2 g via INTRAVENOUS

## 2020-08-04 MED ORDER — PANTOPRAZOLE SODIUM 40 MG PO TBEC
40.0000 mg | DELAYED_RELEASE_TABLET | Freq: Two times a day (BID) | ORAL | Status: DC
  Administered 2020-08-04: 40 mg via ORAL
  Filled 2020-08-04: qty 1

## 2020-08-04 MED ORDER — MENTHOL 3 MG MT LOZG
1.0000 | LOZENGE | OROMUCOSAL | Status: DC | PRN
  Filled 2020-08-04: qty 9

## 2020-08-04 MED ORDER — PROPOFOL 10 MG/ML IV BOLUS
INTRAVENOUS | Status: AC
  Filled 2020-08-04: qty 20

## 2020-08-04 MED ORDER — PHENOL 1.4 % MT LIQD
1.0000 | OROMUCOSAL | Status: DC | PRN
  Filled 2020-08-04: qty 177

## 2020-08-04 MED ORDER — LACTATED RINGERS IV BOLUS
250.0000 mL | Freq: Once | INTRAVENOUS | Status: AC
  Administered 2020-08-04: 250 mL via INTRAVENOUS

## 2020-08-04 MED ORDER — ACETAMINOPHEN 325 MG PO TABS: 325.0000 mg | ORAL_TABLET | Freq: Four times a day (QID) | ORAL | Status: DC | PRN

## 2020-08-04 MED ORDER — CEFAZOLIN SODIUM-DEXTROSE 2-4 GM/100ML-% IV SOLN
INTRAVENOUS | Status: AC
  Filled 2020-08-04: qty 100

## 2020-08-04 MED ORDER — SODIUM CHLORIDE 0.9 % IV SOLN
INTRAVENOUS | Status: DC | PRN
  Administered 2020-08-04: 60 mL

## 2020-08-04 MED ORDER — FAMOTIDINE 20 MG PO TABS
ORAL_TABLET | ORAL | Status: AC
  Administered 2020-08-04: 20 mg via ORAL
  Filled 2020-08-04: qty 1

## 2020-08-04 MED ORDER — HYDROMORPHONE HCL 1 MG/ML IJ SOLN: 0.5000 mg | INTRAMUSCULAR | Status: DC | PRN

## 2020-08-04 MED ORDER — CEFAZOLIN SODIUM-DEXTROSE 2-4 GM/100ML-% IV SOLN: 2.0000 g | Freq: Four times a day (QID) | INTRAVENOUS | Status: DC

## 2020-08-04 MED ORDER — CELECOXIB 200 MG PO CAPS
ORAL_CAPSULE | ORAL | Status: AC
  Administered 2020-08-04: 400 mg via ORAL
  Filled 2020-08-04: qty 2

## 2020-08-04 MED ORDER — TRANEXAMIC ACID-NACL 1000-0.7 MG/100ML-% IV SOLN
INTRAVENOUS | Status: AC
  Administered 2020-08-04: 1000 mg via INTRAVENOUS
  Filled 2020-08-04: qty 100

## 2020-08-04 MED ORDER — CELECOXIB 200 MG PO CAPS: 400.0000 mg | ORAL_CAPSULE | Freq: Once | ORAL | Status: AC

## 2020-08-04 MED ORDER — ONDANSETRON HCL 4 MG PO TABS: 4.0000 mg | ORAL_TABLET | Freq: Four times a day (QID) | ORAL | Status: DC | PRN

## 2020-08-04 MED ORDER — SODIUM CHLORIDE 0.9 % IV SOLN: INTRAVENOUS | Status: DC

## 2020-08-04 MED ORDER — CELECOXIB 200 MG PO CAPS: 200.0000 mg | ORAL_CAPSULE | Freq: Two times a day (BID) | ORAL | 2 refills | Status: AC

## 2020-08-04 MED ORDER — ENSURE PRE-SURGERY PO LIQD
296.0000 mL | Freq: Once | ORAL | Status: AC
  Administered 2020-08-04: 296 mL via ORAL
  Filled 2020-08-04: qty 296

## 2020-08-04 MED ORDER — ENOXAPARIN SODIUM 40 MG/0.4ML ~~LOC~~ SOLN: 40.0000 mg | SUBCUTANEOUS | Status: DC

## 2020-08-04 MED ORDER — NEOMYCIN-POLYMYXIN B GU 40-200000 IR SOLN
Status: AC
  Filled 2020-08-04: qty 20

## 2020-08-04 MED ORDER — CELECOXIB 200 MG PO CAPS: 200.0000 mg | ORAL_CAPSULE | Freq: Two times a day (BID) | ORAL | Status: DC

## 2020-08-04 MED ORDER — OXYCODONE HCL 5 MG PO TABS: 5.0000 mg | ORAL_TABLET | ORAL | Status: DC | PRN

## 2020-08-04 MED ORDER — TRANEXAMIC ACID-NACL 1000-0.7 MG/100ML-% IV SOLN
1000.0000 mg | INTRAVENOUS | Status: AC
  Administered 2020-08-04: 1000 mg via INTRAVENOUS

## 2020-08-04 MED ORDER — BUPIVACAINE LIPOSOME 1.3 % IJ SUSP
INTRAMUSCULAR | Status: AC
  Filled 2020-08-04: qty 20

## 2020-08-04 MED ORDER — FLEET ENEMA 7-19 GM/118ML RE ENEM: 1.0000 | ENEMA | Freq: Once | RECTAL | Status: DC | PRN

## 2020-08-04 MED ORDER — DEXAMETHASONE SODIUM PHOSPHATE 10 MG/ML IJ SOLN
INTRAMUSCULAR | Status: AC
  Administered 2020-08-04: 8 mg via INTRAVENOUS
  Filled 2020-08-04: qty 1

## 2020-08-04 MED ORDER — CHLORHEXIDINE GLUCONATE 4 % EX LIQD
60.0000 mL | Freq: Once | CUTANEOUS | Status: AC
  Administered 2020-08-04: 4 via TOPICAL

## 2020-08-04 MED ORDER — FERROUS SULFATE 325 (65 FE) MG PO TABS
325.0000 mg | ORAL_TABLET | Freq: Two times a day (BID) | ORAL | Status: DC
Start: 2020-08-04 — End: 2020-08-04

## 2020-08-04 MED ORDER — ORAL CARE MOUTH RINSE: 15.0000 mL | Freq: Once | OROMUCOSAL | Status: AC

## 2020-08-04 MED ORDER — ONDANSETRON HCL 4 MG/2ML IJ SOLN: 4.0000 mg | Freq: Four times a day (QID) | INTRAMUSCULAR | Status: DC | PRN

## 2020-08-04 MED ORDER — MAGNESIUM HYDROXIDE 400 MG/5ML PO SUSP: 30.0000 mL | Freq: Every day | ORAL | Status: DC

## 2020-08-04 MED ORDER — ACETAMINOPHEN 10 MG/ML IV SOLN
INTRAVENOUS | Status: DC | PRN
  Administered 2020-08-04: 1000 mg via INTRAVENOUS

## 2020-08-04 MED ORDER — CHLORHEXIDINE GLUCONATE 0.12 % MT SOLN: 15.0000 mL | Freq: Once | OROMUCOSAL | Status: AC

## 2020-08-04 MED ORDER — LACTATED RINGERS IV SOLN: INTRAVENOUS | Status: DC

## 2020-08-04 MED ORDER — FENTANYL CITRATE (PF) 100 MCG/2ML IJ SOLN: 25.0000 ug | INTRAMUSCULAR | Status: DC | PRN

## 2020-08-04 MED ORDER — BISACODYL 10 MG RE SUPP
10.0000 mg | Freq: Every day | RECTAL | Status: DC | PRN
  Filled 2020-08-04: qty 1

## 2020-08-04 MED ORDER — ALUM & MAG HYDROXIDE-SIMETH 200-200-20 MG/5ML PO SUSP: 30.0000 mL | ORAL | Status: DC | PRN

## 2020-08-04 MED ORDER — SODIUM CHLORIDE FLUSH 0.9 % IV SOLN
INTRAVENOUS | Status: AC
  Filled 2020-08-04: qty 40

## 2020-08-04 MED ORDER — MAGNESIUM HYDROXIDE 400 MG/5ML PO SUSP
ORAL | Status: AC
  Administered 2020-08-04: 30 mL via ORAL
  Filled 2020-08-04: qty 30

## 2020-08-04 MED ORDER — ACETAMINOPHEN 10 MG/ML IV SOLN
INTRAVENOUS | Status: AC
  Filled 2020-08-04: qty 100

## 2020-08-04 MED ORDER — ONDANSETRON HCL 4 MG/2ML IJ SOLN
INTRAMUSCULAR | Status: DC | PRN
  Administered 2020-08-04: 4 mg via INTRAVENOUS

## 2020-08-04 MED ORDER — OXYCODONE HCL 5 MG PO TABS: 5.0000 mg | ORAL_TABLET | Freq: Once | ORAL | Status: DC | PRN

## 2020-08-04 MED ORDER — LACTATED RINGERS IV BOLUS
500.0000 mL | Freq: Once | INTRAVENOUS | Status: AC
  Administered 2020-08-04: 500 mL via INTRAVENOUS

## 2020-08-04 MED ORDER — ENOXAPARIN SODIUM 40 MG/0.4ML ~~LOC~~ SOLN: 40.0000 mg | SUBCUTANEOUS | 0 refills | Status: AC

## 2020-08-04 MED ORDER — DEXAMETHASONE SODIUM PHOSPHATE 10 MG/ML IJ SOLN: 8.0000 mg | Freq: Once | INTRAMUSCULAR | Status: AC

## 2020-08-04 SURGICAL SUPPLY — 80 items
ATTUNE MED DOME PAT 41 KNEE (Knees) ×1 IMPLANT
ATTUNE PS FEM LT SZ 7 CEM KNEE (Femur) ×1 IMPLANT
ATTUNE PSRP INSR SZ7 5 KNEE (Insert) ×1 IMPLANT
BASE TIBIAL ROT PLAT SZ 7 KNEE (Knees) IMPLANT
BATTERY INSTRU NAVIGATION (MISCELLANEOUS) ×8 IMPLANT
BLADE SAW 70X12.5 (BLADE) ×2 IMPLANT
BLADE SAW 90X13X1.19 OSCILLAT (BLADE) ×2 IMPLANT
BLADE SAW 90X25X1.19 OSCILLAT (BLADE) ×2 IMPLANT
BSPLAT TIB 7 CMNT ROT PLAT STR (Knees) ×1 IMPLANT
BTRY SRG DRVR LF (MISCELLANEOUS) ×4
CANISTER PREVENA PLUS 150 (CANNISTER) ×2 IMPLANT
CANISTER SUCT 3000ML PPV (MISCELLANEOUS) ×2 IMPLANT
CEMENT BONE GENTAMICIN (Cement) IMPLANT
CEMENT HV SMART SET (Cement) ×1 IMPLANT
COOLER POLAR GLACIER W/PUMP (MISCELLANEOUS) ×2 IMPLANT
COVER WAND RF STERILE (DRAPES) ×2 IMPLANT
CUFF TOURN SGL QUICK 24 (TOURNIQUET CUFF) ×2
CUFF TRNQT CYL 24X4X16.5-23 (TOURNIQUET CUFF) IMPLANT
DRAPE 3/4 80X56 (DRAPES) ×2 IMPLANT
DRSG DERMACEA 8X12 NADH (GAUZE/BANDAGES/DRESSINGS) ×2 IMPLANT
DRSG MEPILEX SACRM 8.7X9.8 (GAUZE/BANDAGES/DRESSINGS) ×2 IMPLANT
DRSG OPSITE POSTOP 4X14 (GAUZE/BANDAGES/DRESSINGS) ×2 IMPLANT
DRSG TEGADERM 4X4.75 (GAUZE/BANDAGES/DRESSINGS) ×2 IMPLANT
DURAPREP 26ML APPLICATOR (WOUND CARE) ×4 IMPLANT
ELECT REM PT RETURN 9FT ADLT (ELECTROSURGICAL) ×2
ELECTRODE REM PT RTRN 9FT ADLT (ELECTROSURGICAL) ×1 IMPLANT
EX-PIN ORTHOLOCK NAV 4X150 (PIN) ×4 IMPLANT
GLOVE BIO SURGEON STRL SZ7.5 (GLOVE) ×4 IMPLANT
GLOVE BIOGEL PI IND STRL 7.5 (GLOVE) ×1 IMPLANT
GLOVE BIOGEL PI INDICATOR 7.5 (GLOVE) ×1
GLOVE INDICATOR 8.0 STRL GRN (GLOVE) ×2 IMPLANT
GLOVE SURG ENC TEXT LTX SZ7.5 (GLOVE) ×4 IMPLANT
GOWN STRL REUS W/ TWL LRG LVL3 (GOWN DISPOSABLE) ×2 IMPLANT
GOWN STRL REUS W/ TWL XL LVL3 (GOWN DISPOSABLE) ×1 IMPLANT
GOWN STRL REUS W/TWL LRG LVL3 (GOWN DISPOSABLE) ×4
GOWN STRL REUS W/TWL XL LVL3 (GOWN DISPOSABLE) ×2
HEMOVAC 400CC 10FR (MISCELLANEOUS) ×2 IMPLANT
HOLDER FOLEY CATH W/STRAP (MISCELLANEOUS) ×2 IMPLANT
HOOD PEEL AWAY FLYTE STAYCOOL (MISCELLANEOUS) ×4 IMPLANT
IRRIGATION SURGIPHOR STRL (IV SOLUTION) ×2 IMPLANT
KIT PREVENA INCISION MGT20CM45 (CANNISTER) ×2 IMPLANT
KIT PUMP PREVENA PLUS 14DAY (MISCELLANEOUS) ×2 IMPLANT
KIT TURNOVER KIT A (KITS) ×2 IMPLANT
KNIFE SCULPS 14X20 (INSTRUMENTS) ×2 IMPLANT
LABEL OR SOLS (LABEL) ×2 IMPLANT
MANIFOLD NEPTUNE II (INSTRUMENTS) ×4 IMPLANT
NDL SAFETY ECLIPSE 18X1.5 (NEEDLE) ×1 IMPLANT
NDL SPNL 20GX3.5 QUINCKE YW (NEEDLE) ×2 IMPLANT
NEEDLE HYPO 18GX1.5 SHARP (NEEDLE) ×2
NEEDLE SPNL 20GX3.5 QUINCKE YW (NEEDLE) ×4 IMPLANT
NS IRRIG 500ML POUR BTL (IV SOLUTION) ×2 IMPLANT
PACK TOTAL KNEE (MISCELLANEOUS) ×2 IMPLANT
PAD ABD DERMACEA PRESS 5X9 (GAUZE/BANDAGES/DRESSINGS) ×2 IMPLANT
PAD WRAPON POLAR KNEE (MISCELLANEOUS) ×1 IMPLANT
PENCIL SMOKE ULTRAEVAC 22 CON (MISCELLANEOUS) ×2 IMPLANT
PIN DRILL QUICK PACK ×2 IMPLANT
PIN FIXATION 1/8DIA X 3INL (PIN) ×6 IMPLANT
PULSAVAC PLUS IRRIG FAN TIP (DISPOSABLE) ×2
SOL .9 NS 3000ML IRR  AL (IV SOLUTION) ×1
SOL .9 NS 3000ML IRR AL (IV SOLUTION) ×1
SOL .9 NS 3000ML IRR UROMATIC (IV SOLUTION) ×1 IMPLANT
SOL PREP PVP 2OZ (MISCELLANEOUS) ×2
SOLUTION PREP PVP 2OZ (MISCELLANEOUS) ×1 IMPLANT
SPONGE DRAIN TRACH 4X4 STRL 2S (GAUZE/BANDAGES/DRESSINGS) ×2 IMPLANT
STAPLER SKIN PROX 35W (STAPLE) ×2 IMPLANT
STOCKINETTE IMPERV 14X48 (MISCELLANEOUS) ×1 IMPLANT
STRAP TIBIA SHORT (MISCELLANEOUS) ×2 IMPLANT
SUCTION FRAZIER HANDLE 10FR (MISCELLANEOUS) ×1
SUCTION TUBE FRAZIER 10FR DISP (MISCELLANEOUS) ×1 IMPLANT
SUT VIC AB 0 CT1 36 (SUTURE) ×4 IMPLANT
SUT VIC AB 1 CT1 36 (SUTURE) ×4 IMPLANT
SUT VIC AB 2-0 CT2 27 (SUTURE) ×2 IMPLANT
SYR 20ML LL LF (SYRINGE) ×2 IMPLANT
SYR 30ML LL (SYRINGE) ×4 IMPLANT
TIBIAL BASE ROT PLAT SZ 7 KNEE (Knees) ×2 IMPLANT
TIP FAN IRRIG PULSAVAC PLUS (DISPOSABLE) ×1 IMPLANT
TOWEL OR 17X26 4PK STRL BLUE (TOWEL DISPOSABLE) ×2 IMPLANT
TOWER CARTRIDGE SMART MIX (DISPOSABLE) ×2 IMPLANT
TRAY FOLEY MTR SLVR 16FR STAT (SET/KITS/TRAYS/PACK) ×2 IMPLANT
WRAPON POLAR PAD KNEE (MISCELLANEOUS) ×2

## 2020-08-04 NOTE — Evaluation (Signed)
Physical Therapy Evaluation Patient Details Name: Earl Holmes MRN: [id number] DOB: 13-Dec-1940 Today's Date: 08/04/2020   History of Present Illness  80yo male pt s/p L TKA with PMHX including R TKA (2003), balanoposthitis, BPH, eczema, GERD, HLD, HTN, vertigo.  Clinical Impression  Pt is a pleasant 80 year old male who was admitted for L TKR. Pt performs bed mobility with supervision and transfers/ambulation with cga and RW. Pt demonstrates ability to perform 10 SLRs with independence, therefore does not require KI for mobility. Pt demonstrates deficits with strength/mobility/ROM. Pt demonstrates good safety awareness and has appropriate supervision for home discharge this date. RN and MD aware. Further recommendation for HHPT. Will discharge.    Follow Up Recommendations Home health PT    Equipment Recommendations  None recommended by PT    Recommendations for Other Services       Precautions / Restrictions Precautions Precautions: Fall;Knee Precaution Booklet Issued: Yes (comment) Restrictions Weight Bearing Restrictions: Yes LLE Weight Bearing: Weight bearing as tolerated      Mobility  Bed Mobility Overal bed mobility: Needs Assistance Bed Mobility: Supine to Sit;Sit to Supine     Supine to sit: Supervision Sit to supine: Supervision   General bed mobility comments: safe technique with upright posture    Transfers Overall transfer level: Needs assistance Equipment used: Rolling walker (2 wheeled) Transfers: Sit to/from Stand Sit to Stand: Min guard         General transfer comment: safe technique  Ambulation/Gait Ambulation/Gait assistance: Min guard Gait Distance (Feet): 70 Feet Assistive device: Rolling walker (2 wheeled) Gait Pattern/deviations: Step-to pattern     General Gait Details: initial step to gait pattern with ability to progress to reciprocal gait. Good weight acceptance on surgical extremity.  Stairs Stairs: Yes Stairs assistance:  Min guard Stair Management: Backwards;No rails;Step to pattern Number of Stairs: 2 General stair comments: Safe technique  Wheelchair Mobility    Modified Rankin (Stroke Patients Only)       Balance Overall balance assessment: Mild deficits observed, not formally tested                                           Pertinent Vitals/Pain Pain Assessment: 0-10 Pain Score: 2  Pain Location: L knee Pain Descriptors / Indicators: Operative site guarding Pain Intervention(s): Limited activity within patient's tolerance;Ice applied    Home Living Family/patient expects to be discharged to:: Private residence Living Arrangements: Alone Available Help at Discharge: Family;Available 24 hours/day (brother will be staying with him, also have neighbor support) Type of Home: House Home Access: Stairs to enter Entrance Stairs-Rails: None Entrance Stairs-Number of Steps: 2 Home Layout: One level Home Equipment: Walker - 2 wheels;Walker - 4 wheels;Cane - single point;Crutches Additional Comments: getting a grab bar installed soon    Prior Function Level of Independence: Independent         Comments: Pt independent at baseline, no falls, drives, retired     Journalist, newspaper   Dominant Hand: Right    Extremity/Trunk Assessment   Upper Extremity Assessment Upper Extremity Assessment: Overall WFL for tasks assessed    Lower Extremity Assessment Lower Extremity Assessment: Generalized weakness (L LE grossly 4/5; R LE grossly 5/5) LLE Deficits / Details: s/p L TKA, decreased ROM/strength, bandaging limiting active knee flexion       Communication   Communication: No difficulties  Cognition Arousal/Alertness: Awake/alert Behavior During  Therapy: WFL for tasks assessed/performed Overall Cognitive Status: Within Functional Limits for tasks assessed                                        General Comments General comments (skin integrity, edema,  etc.): hemovac in place, noted with drainage once standing and ambulating, RN aware    Exercises Total Joint Exercises Goniometric ROM: L knee AAROM: 0-80 degrees Other Exercises Other Exercises: Pt instructed in falls prevention, home/routines modifications, AE/DME, copmression stocking mgt, polar care mgt, and hemovac; handout provided to support recall and carryover Other Exercises: Supine ther-ex performed on L LE including AP, QS, SLRs, hip abd/add, SAQ, heel slides, LAQ, and knee flexion stretches. All ther-ex performed x 10 reps with cga. Educated on frequency/duration. Gave written HEP   Assessment/Plan    PT Assessment All further PT needs can be met in the next venue of care  PT Problem List Decreased strength;Decreased activity tolerance;Decreased balance;Decreased mobility;Pain       PT Treatment Interventions      PT Goals (Current goals can be found in the Care Plan section)  Acute Rehab PT Goals Patient Stated Goal: go home PT Goal Formulation: All assessment and education complete, DC therapy Time For Goal Achievement: 08/04/20 Potential to Achieve Goals: Good    Frequency     Barriers to discharge        Co-evaluation               AM-PAC PT "6 Clicks" Mobility  Outcome Measure Help needed turning from your back to your side while in a flat bed without using bedrails?: None Help needed moving from lying on your back to sitting on the side of a flat bed without using bedrails?: None Help needed moving to and from a bed to a chair (including a wheelchair)?: A Little Help needed standing up from a chair using your arms (e.g., wheelchair or bedside chair)?: A Little Help needed to walk in hospital room?: A Little Help needed climbing 3-5 steps with a railing? : A Little 6 Click Score: 20    End of Session Equipment Utilized During Treatment: Gait belt Activity Tolerance: Patient tolerated treatment well Patient left: in bed Nurse Communication:  Mobility status PT Visit Diagnosis: Muscle weakness (generalized) (M62.81);Difficulty in walking, not elsewhere classified (R26.2);Pain Pain - Right/Left: Left Pain - part of body: Knee    Time: 5638-7564 PT Time Calculation (min) (ACUTE ONLY): 38 min   Charges:   PT Evaluation $PT Eval Low Complexity: 1 Low PT Treatments $Gait Training: 8-22 mins $Therapeutic Exercise: 8-22 mins        Greggory Stallion, PT, DPT [phone]   Reising,Ballard Budney 08/04/2020, 4:19 PM

## 2020-08-04 NOTE — Anesthesia Preprocedure Evaluation (Addendum)
Anesthesia Evaluation  Patient identified by MRN, date of birth, ID band Patient awake    Reviewed: Allergy & Precautions, H&P , NPO status , Patient's Chart, lab work & pertinent test results  History of Anesthesia Complications Negative for: history of anesthetic complications  Airway Mallampati: II  TM Distance: >3 FB     Dental  (+) Poor Dentition   Pulmonary neg pulmonary ROS, neg sleep apnea, neg COPD,    breath sounds clear to auscultation       Cardiovascular hypertension, (-) angina(-) Past MI and (-) Cardiac Stents (-) dysrhythmias  Rhythm:regular Rate:Normal     Neuro/Psych negative neurological ROS  negative psych ROS   GI/Hepatic Neg liver ROS, GERD  Controlled,  Endo/Other  negative endocrine ROS  Renal/GU      Musculoskeletal   Abdominal   Peds  Hematology negative hematology ROS (+)   Anesthesia Other Findings Past Medical History: No date: Balanoposthitis No date: BPH (benign prostatic hyperplasia) No date: Degenerative arthritis of left knee No date: Eczema No date: Enlarged prostate with lower urinary tract symptoms (LUTS) No date: GERD (gastroesophageal reflux disease) No date: Hyperlipidemia No date: Hypertension 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 "Thinks my               blood pressure was dropping" No date: Vertigo     Comment:  major episode 07/10/17.  other minor since.  Currently               having some issues.  Past Surgical History: 07/26/2018: CATARACT EXTRACTION W/PHACO; Right     Comment:  Procedure: CATARACT EXTRACTION PHACO AND INTRAOCULAR               LENS PLACEMENT (IOC)  RIGHT;  Surgeon: Lockie Mola, MD;  Location: [facility];  Service:               Ophthalmology;  Laterality: Right; 09/12/2018: CATARACT EXTRACTION W/PHACO; Left     Comment:  Procedure: CATARACT EXTRACTION PHACO AND INTRAOCULAR               LENS PLACEMENT (IOC) LEFT;  Surgeon: Lockie Mola, MD;  Location: [facility];  Service:               Ophthalmology;  Laterality: Left; 05/19/2015: COLONOSCOPY; N/A     Comment:  Procedure: COLONOSCOPY;  Surgeon: Wallace Cullens, MD;                Location: [facility];  Service: Gastroenterology;                Laterality: N/A; 11/22/2019: COLONOSCOPY WITH PROPOFOL; N/A     Comment:  Procedure: COLONOSCOPY WITH PROPOFOL;  Surgeon: Sung Amabile, DO;  Location: [facility];  Service: General;               Laterality: N/A; No date: EYE SURGERY No date:  HERNIA REPAIR; Left     Comment:  inguinal No date: JOINT REPLACEMENT 2003: KNEE SURGERY; Right     Comment:  partial knee replacement  BMI    Body Mass Index: 26.18 kg/m      Reproductive/Obstetrics negative OB ROS                            Anesthesia Physical Anesthesia Plan  ASA: II  Anesthesia Plan: Spinal   Post-op Pain Management:    Induction:   PONV Risk Score and Plan: Propofol infusion and Ondansetron  Airway Management Planned:   Additional Equipment:   Intra-op Plan:   Post-operative Plan:   Informed Consent: I have reviewed the patients History and Physical, chart, labs and discussed the procedure including the risks, benefits and alternatives for the proposed anesthesia with the patient or authorized representative who has indicated his/her understanding and acceptance.     Dental Advisory Given  Plan Discussed with: Anesthesiologist, CRNA and Surgeon  Anesthesia Plan Comments:         Anesthesia Quick Evaluation

## 2020-08-04 NOTE — Transfer of Care (Signed)
Immediate Anesthesia Transfer of Care Note  Patient: Earl Holmes  Procedure(s) Performed: COMPUTER ASSISTED TOTAL KNEE ARTHROPLASTY (Left Knee) APPLICATION OF PERVENA WOUND VAC (Left Knee)  Patient Location: PACU  Anesthesia Type:Spinal  Level of Consciousness: awake, alert  and oriented  Airway & Oxygen Therapy: Patient Spontanous Breathing and Patient connected to face mask oxygen  Post-op Assessment: Report given to RN and Post -op Vital signs reviewed and stable  Post vital signs: Reviewed and stable  Last Vitals:  Vitals Value Taken Time  BP 133/75 08/04/20 1115  Temp 36.3 C 08/04/20 1111  Pulse 70 08/04/20 1116  Resp 16 08/04/20 1116  SpO2 92 % 08/04/20 1116  Vitals shown include unvalidated device data.  Last Pain:  Vitals:   08/04/20 1111  TempSrc:   PainSc: 0-No pain         Complications: No complications documented.

## 2020-08-04 NOTE — Op Note (Signed)
OPERATIVE NOTE  DATE OF SURGERY:  08/04/2020  PATIENT NAME:  Earl Holmes   DOB: 1940-12-20  MRN: [id number]  PRE-OPERATIVE DIAGNOSIS: Degenerative arthrosis of the left knee, primary  POST-OPERATIVE DIAGNOSIS:  Same  PROCEDURE:  Left total knee arthroplasty using computer-assisted navigation  SURGEON:  Jena Gauss. M.D.  ASSISTANT: Baldwin Jamaica, PA-C (present and scrubbed throughout the case, critical for assistance with exposure, retraction, instrumentation, and closure)  ANESTHESIA: spinal  ESTIMATED BLOOD LOSS: 50 mL  FLUIDS REPLACED: 1200 mL of crystalloid  TOURNIQUET TIME: 93 minutes  DRAINS: Wound VAC  SOFT TISSUE RELEASES: Anterior cruciate ligament, posterior cruciate ligament, deep medial collateral ligament, patellofemoral ligament  IMPLANTS UTILIZED: DePuy Attune size 7 posterior stabilized femoral component (cemented), size 7 rotating platform tibial component (cemented), 41 mm medialized dome patella (cemented), and a 5 mm stabilized rotating platform polyethylene insert.  INDICATIONS FOR SURGERY: Earl Holmes is a 80 y.o. year old male with a long history of progressive knee pain. X-rays demonstrated severe degenerative changes in tricompartmental fashion. The patient had not seen any significant improvement despite conservative nonsurgical intervention. After discussion of the risks and benefits of surgical intervention, the patient expressed understanding of the risks benefits and agree with plans for total knee arthroplasty.   The risks, benefits, and alternatives were discussed at length including but not limited to the risks of infection, bleeding, nerve injury, stiffness, blood clots, the need for revision surgery, cardiopulmonary complications, among others, and they were willing to proceed.  PROCEDURE IN DETAIL: The patient was brought into the operating room and, after adequate spinal anesthesia was achieved, a tourniquet was placed on the  patient's upper thigh. The patient's knee and leg were cleaned and prepped with alcohol and DuraPrep and draped in the usual sterile fashion. A "timeout" was performed as per usual protocol. The lower extremity was exsanguinated using an Esmarch, and the tourniquet was inflated to 300 mmHg. An anterior longitudinal incision was made followed by a standard mid vastus approach. The deep fibers of the medial collateral ligament were elevated in a subperiosteal fashion off of the medial flare of the tibia so as to maintain a continuous soft tissue sleeve. The patella was subluxed laterally and the patellofemoral ligament was incised. Inspection of the knee demonstrated severe degenerative changes with full-thickness loss of articular cartilage. Osteophytes were debrided using a rongeur. Anterior and posterior cruciate ligaments were excised. Two 4.0 mm Schanz pins were inserted in the femur and into the tibia for attachment of the array of trackers used for computer-assisted navigation. Hip center was identified using a circumduction technique. Distal landmarks were mapped using the computer. The distal femur and proximal tibia were mapped using the computer. The distal femoral cutting guide was positioned using computer-assisted navigation so as to achieve a 5 distal valgus cut. The femur was sized and it was felt that a size 7 femoral component was appropriate. A size 7 femoral cutting guide was positioned and the anterior cut was performed and verified using the computer. This was followed by completion of the posterior and chamfer cuts. Femoral cutting guide for the central box was then positioned in the center box cut was performed.  Attention was then directed to the proximal tibia. Medial and lateral menisci were excised. The extramedullary tibial cutting guide was positioned using computer-assisted navigation so as to achieve a 0 varus-valgus alignment and 3 posterior slope. The cut was performed and  verified using the computer. The proximal tibia was sized and  it was felt that a size 7 tibial tray was appropriate. Tibial and femoral trials were inserted followed by insertion of a 5 mm polyethylene insert. This allowed for excellent mediolateral soft tissue balancing both in flexion and in full extension. Finally, the patella was cut and prepared so as to accommodate a 41 mm medialized dome patella. A patella trial was placed and the knee was placed through a range of motion with excellent patellar tracking appreciated. The femoral trial was removed after debridement of posterior osteophytes. The central post-hole for the tibial component was reamed followed by insertion of a keel punch. Tibial trials were then removed. Cut surfaces of bone were irrigated with copious amounts of normal saline using pulsatile lavage and then suctioned dry. Polymethylmethacrylate cement was prepared in the usual fashion using a vacuum mixer. Cement was applied to the cut surface of the proximal tibia as well as along the undersurface of a size 7 rotating platform tibial component. Tibial component was positioned and impacted into place. Excess cement was removed using Personal assistant. Cement was then applied to the cut surfaces of the femur as well as along the posterior flanges of the size 7 femoral component. The femoral component was positioned and impacted into place. Excess cement was removed using Personal assistant. A 5 mm polyethylene trial was inserted and the knee was brought into full extension with steady axial compression applied. Finally, cement was applied to the backside of a 41 mm medialized dome patella and the patellar component was positioned and patellar clamp applied. Excess cement was removed using Personal assistant. After adequate curing of the cement, the tourniquet was deflated after a total tourniquet time of 93 minutes. Hemostasis was achieved using electrocautery. The knee was irrigated with copious amounts  of normal saline using pulsatile lavage followed by 500 ml of Surgiphor and then suctioned dry. 20 mL of 1.3% Exparel and 60 mL of 0.25% Marcaine in 40 mL of normal saline was injected along the posterior capsule, medial and lateral gutters, and along the arthrotomy site. A 5 mm stabilized rotating platform polyethylene insert was inserted and the knee was placed through a range of motion with excellent mediolateral soft tissue balancing appreciated and excellent patellar tracking noted. The medial parapatellar portion of the incision was reapproximated using interrupted sutures of #1 Vicryl. Subcutaneous tissue was approximated in layers using first #0 Vicryl followed #2-0 Vicryl. The skin was approximated with skin staples. A wound VAC and a sterile dressing were applied.  The patient tolerated the procedure well and was transported to the recovery room in stable condition.    Matthe P. Angie Fava., M.D.

## 2020-08-04 NOTE — Anesthesia Procedure Notes (Signed)
Spinal  Patient location during procedure: OR Start time: 08/04/2020 7:20 AM End time: 08/04/2020 7:24 AM Staffing Performed: resident/CRNA  Resident/CRNA: Nelda Marseille, CRNA Preanesthetic Checklist Completed: patient identified, IV checked, site marked, risks and benefits discussed, surgical consent, monitors and equipment checked, pre-op evaluation and timeout performed Spinal Block Patient position: sitting Prep: Betadine Patient monitoring: heart rate, continuous pulse ox, blood pressure and cardiac monitor Approach: midline Location: L4-5 Injection technique: single-shot Needle Needle type: Whitacre and Introducer  Needle gauge: 25 G Needle length: 9 cm Assessment Sensory level: T10 Additional Notes Negative paresthesia. Negative blood return. Positive free-flowing CSF. Expiration date of kit checked and confirmed. Patient tolerated procedure well, without complications.

## 2020-08-04 NOTE — Anesthesia Procedure Notes (Signed)
Date/Time: 08/04/2020 7:37 AM Performed by: Junious Silk, CRNA Pre-anesthesia Checklist: Patient identified, Emergency Drugs available, Suction available, Patient being monitored and Timeout performed Oxygen Delivery Method: Simple face mask

## 2020-08-04 NOTE — Evaluation (Signed)
Occupational Therapy Evaluation Patient Details Name: Earl Holmes MRN: [id number] DOB: December 08, 1940 Today's Date: 08/04/2020    History of Present Illness 80yo male pt s/p L TKA with PMHX including R TKA (2003), balanoposthitis, BPH, eczema, GERD, HLD, HTN, vertigo.   Clinical Impression   Pt seen for OT evaluation this date, POD#1 from above surgery. Pt was independent in all ADL prior to surgery and is eager to return to PLOF with less pain and improved safety and independence. Pt lives alone in a 1 story home but reports plans to have his brother stay with him for a while to support his recovery. Pt currently requires PRN minimal assist for LB dressing while in seated position due to pain and limited AROM of L knee. Pt denied pain throughout session, only reporting mild "soreness" when ambulating with 2WW. CGA for ADL transfers after instruction in RW mgt/hand placement. Negotiated obstacles within hall with 2WW without difficulty. Pt instructed in polar care mgt, falls prevention strategies, home/routines modifications, DME/AE for LB bathing and dressing tasks, and compression stocking mgt. Pt verbalized understanding. Handout provided to support recall and carryover. Pt would benefit from skilled OT services including additional instruction in dressing techniques with or without assistive devices for dressing and bathing skills to support recall and carryover prior to discharge and ultimately to maximize safety, independence, and minimize falls risk and caregiver burden. Do not currently anticipate any OT needs following this hospitalization.      Follow Up Recommendations  No OT follow up    Equipment Recommendations  3 in 1 bedside commode    Recommendations for Other Services       Precautions / Restrictions Precautions Precautions: Fall Restrictions Weight Bearing Restrictions: Yes LLE Weight Bearing: Weight bearing as tolerated      Mobility Bed Mobility Overal bed  mobility: Needs Assistance Bed Mobility: Supine to Sit;Sit to Supine     Supine to sit: Supervision Sit to supine: Supervision        Transfers Overall transfer level: Needs assistance Equipment used: Rolling walker (2 wheeled) Transfers: Sit to/from Stand Sit to Stand: Min guard         General transfer comment: cues for hand placement prior to attempt, good carryover    Balance Overall balance assessment: Mild deficits observed, not formally tested                                         ADL either performed or assessed with clinical judgement   ADL Overall ADL's : Needs assistance/impaired                                       General ADL Comments: PRN Min A for compression stocking mgt on LLE and LB dressing/bathing. CGA for ADL transfers.     Vision Patient Visual Report: No change from baseline       Perception     Praxis      Pertinent Vitals/Pain Pain Assessment: No/denies pain (once standing and walking, pt reporting "soreness" but denies pain)     Hand Dominance Right   Extremity/Trunk Assessment Upper Extremity Assessment Upper Extremity Assessment: Overall WFL for tasks assessed   Lower Extremity Assessment Lower Extremity Assessment: Overall WFL for tasks assessed;LLE deficits/detail LLE Deficits / Details: s/p L TKA, decreased ROM/strength, bandaging  limiting active knee flexion       Communication Communication Communication: No difficulties   Cognition Arousal/Alertness: Awake/alert Behavior During Therapy: WFL for tasks assessed/performed Overall Cognitive Status: Within Functional Limits for tasks assessed                                     General Comments  hemovac in place, noted with drainage once standing and ambulating, RN aware    Exercises Other Exercises Other Exercises: Pt instructed in falls prevention, home/routines modifications, AE/DME, copmression stocking mgt,  polar care mgt, and hemovac; handout provided to support recall and carryover   Shoulder Instructions      Home Living Family/patient expects to be discharged to:: Private residence Living Arrangements: Alone Available Help at Discharge: Family;Available 24 hours/day (brother will be staying with him for a while) Type of Home: House Home Access: Stairs to enter [employer] of Steps: 2 steps from patio (plans to use this entrance) no rails, 7 steps w/ L rail from front of house Entrance Stairs-Rails: None Home Layout: One level     Bathroom Shower/Tub: Tub/shower unit;Walk-in shower   Bathroom Toilet: Handicapped height (has both)     Home Equipment: Walker - 2 wheels;Walker - 4 wheels;Cane - single point;Crutches   Additional Comments: getting a grab bar installed soon      Prior Functioning/Environment Level of Independence: Independent        Comments: Pt independent at baseline, no falls, drives, retired        OT Problem List: Decreased strength;Decreased range of motion;Impaired balance (sitting and/or standing);Decreased knowledge of use of DME or AE      OT Treatment/Interventions: Self-care/ADL training;Therapeutic exercise;Therapeutic activities;DME and/or AE instruction;Patient/family education;Balance training    OT Goals(Current goals can be found in the care plan section) Acute Rehab OT Goals Patient Stated Goal: go home OT Goal Formulation: With patient Time For Goal Achievement: 08/18/20 Potential to Achieve Goals: Good ADL Goals Pt Will Perform Lower Body Dressing: with modified independence;sit to/from stand Pt Will Transfer to Toilet: with modified independence;ambulating;regular height toilet (LRAD for amb) Additional ADL Goal #1: Pt will independently instruct family/caregiver in polar care mgt, compression stocking mgt, and hemovac mgt  OT Frequency: Min 1X/week   Barriers to D/C:            Co-evaluation               AM-PAC OT "6 Clicks" Daily Activity     Outcome Measure Help from another person eating meals?: None Help from another person taking care of personal grooming?: None Help from another person toileting, which includes using toliet, bedpan, or urinal?: A Little Help from another person bathing (including washing, rinsing, drying)?: A Little Help from another person to put on and taking off regular upper body clothing?: None Help from another person to put on and taking off regular lower body clothing?: A Little 6 Click Score: 21   End of Session Equipment Utilized During Treatment: Gait belt;Rolling walker Nurse Communication: Mobility status  Activity Tolerance: Patient tolerated treatment well Patient left: in bed;with call bell/phone within reach  OT Visit Diagnosis: Other abnormalities of gait and mobility (R26.89)                Time: 1353-1430 OT Time Calculation (min): 37 min Charges:  OT General Charges $OT Visit: 1 Visit OT Evaluation $OT Eval Low Complexity: 1 Low OT  Treatments $Self Care/Home Management : 23-37 mins  Richrd Prime, MPH, MS, OTR/L ascom [phone] 08/04/20, 2:44 PM

## 2020-08-04 NOTE — Discharge Instructions (Signed)
Instructions after Total Knee Replacement   Earl Holmes., M.D.     Dept. of [facility]  [place] [place], [place]  [address]  Phone: [phone]   Fax: [phone]    DIET:  Drink plenty of non-alcoholic fluids.  Resume your normal diet. Include foods high in fiber.  ACTIVITY:   You may use crutches or a walker with weight-bearing as tolerated, unless instructed otherwise.  You may be weaned off of the walker or crutches by your Physical Therapist.   Do NOT place pillows under the knee. Anything placed under the knee could limit your ability to straighten the knee.    Continue doing gentle exercises. Exercising will reduce the pain and swelling, increase motion, and prevent muscle weakness.    Please continue to use the TED compression stockings for 6 weeks. You may remove the stockings at night, but should reapply them in the morning.  Do not drive or operate any equipment until instructed.  WOUND CARE:   Continue to use the PolarCare or ice packs periodically to reduce pain and swelling.  You may bathe or shower after the staples are removed at the first office visit following surgery.  MEDICATIONS:  You may resume your regular medications.  Please take the pain medication as prescribed on the medication.  Do not take pain medication on an empty stomach.  You have been given a prescription for a blood thinner (Lovenox or Coumadin). Please take the medication as instructed. (NOTE: After completing a 2 week course of Lovenox, take one Enteric-coated aspirin once a day. This along with elevation will help reduce the possibility of phlebitis in your operated leg.)  Do not drive or drink alcoholic beverages when taking pain medications.  CALL THE OFFICE FOR:  Temperature above 101 degrees  Excessive bleeding or drainage on the dressing.  Excessive swelling, coldness, or paleness of the toes.  Persistent  nausea and vomiting.  FOLLOW-UP:   You should have an appointment to return to the office in 10-14 days after surgery.  Arrangements have been made for continuation of Physical Therapy (either home therapy or outpatient therapy).    [facility] Department Directory         [email or web address]       [email or web address]          Cardiology  Appointments: [place] [phone]  Endocrinology  Appointments: [place] [phone] [place] [phone]  Gastroenterology  Appointments: [place] [phone] [place] [phone]        General Surgery   Appointments: [facility]  Internal Medicine/Family Medicine  Appointments: [place] [facility] [place] - [phone] [place] [phone]  Metabolic and [place] Loss Surgery  Appointments: [facility]        Neurology  Appointments: [place] [phone] [place] - [phone]  Neurosurgery  Appointments: [place]  Obstetrics & Gynecology  Appointments: [place] [phone] [place] - [phone]        Pediatrics  Appointments: [place] [phone] [place] - [phone]  Physiatry  Appointments: [place] [phone]  Physical Therapy  Appointments: [place] [phone]        Podiatry  Appointments: [place] [phone] [place] - [phone]  Pulmonology  Appointments: [place]  Rheumatology  Appointments: [place] [phone]        [place] Location: [facility]  [address] [place], [place]  [address]  [place] Location: [facility] [address] [place], [place]  [address]  [place] Location: [facility] [address] [place], [place]  [address]    Negative Pressure Wound Therapy Home Guide Negative pressure wound therapy (NPWT) uses a sponge or foam-like material (dressing) placed  on or inside the wound. The wound is then covered and sealed with a cover dressing that sticks to your skin (is adhesive). This keeps air out. A tube is attached to the cover dressing, and this tube connects to a small pump. The pump sucks fluid and germs from the wound. NPWT helps to increase blood flow to the wound and heal it from the inside. What are the risks? NPWT is usually safe to use. However, problems can occur, including:  Skin irritation from the dressing adhesive.  Bleeding.  Infection.  Dehydration. Wounds with large amounts of drainage can cause excessive fluid loss.  Pain. Supplies needed:  A disposable garbage bag.  Soap and water, or hand sanitizer.  Wound cleanser or salt-water solution (saline).  New sponge and cover dressing.  Protective clothing.  Gauze pad.  Vinyl gloves.  Tape.  Skin protectant. This may be a wipe, film, or spray.  Clean or germ-free (sterile) scissors.  Eye protection. How to change your dressing Prepare to change your dressing 1. If told by your health care provider, take pain medicine 30 minutes before changing the dressing. 2. Wash your hands with soap and water. Dry your hands with a clean towel. If soap and water are not available, use hand sanitizer. 3. Set up a clean station for wound care. 4. Open the dressing package so that the sponge dressing remains on the inside of the package. 5. Wear gloves, protective clothing, and eye protection.   Remove old dressing 1. Turn off the pump and disconnect the tubing from the dressing. 2. Carefully remove the adhesive cover dressing in the direction of your hair growth. 3. Remove the sponge dressing that is inside the wound. If the sponge sticks, use a wound cleanser or saline solution to wet the sponge and help it come off more easily. 4. Throw the old sponge and cover dressing supplies into the garbage bag. 5. Remove your gloves by grabbing the cuff and turning the glove inside  out. Place the gloves in the trash immediately. 6. Wash your hands with soap and water. Dry your hands with a clean towel. If soap and water are not available, use hand sanitizer.   Clean your wound  Wear gloves, protective clothing, and eye protection. Follow your health care provider's instructions on how to clean your wound. You may be told to: 1. Clean the wound using a saline solution or a wound cleanser and a clean gauze pad. 2. Pat the wound dry with a gauze pad. Do not rub the wound. 3. Throw the gauze pad into the garbage bag. 4. Remove your gloves by grabbing the cuff and turning the glove inside out. Place the gloves in the trash immediately. 5. Wash your hands with soap and water. Dry your hands with a clean towel. If soap and water are not available, use hand sanitizer. Apply new dressing  Wear gloves, protective clothing, and eye protection. 1. If told by your health care provider, apply a skin protectant to any skin that will be exposed to adhesive. Let the skin protectant dry. 2. Cut a piece of new sponge dressing and put it on or in the wound. 3. Using clean scissors, cut a nickel-sized hole in the new cover dressing. 4.  Apply the cover dressing. 5. Attach the suction tube over the hole in the cover dressing. 6. Take off your gloves. Put them in the plastic bag with the old dressing. Tie the bag shut and throw it away. 7. Wash your hands with soap and water. Dry your hands with a clean towel. If soap and water are not available, use hand sanitizer. 8. Turn the pump back on. The sponge dressing should collapse. Do not change the settings on the machine without talking to a health care provider. 9. Replace the container in the pump that collects fluid if it is full. Replace the container per the manufacturer's instructions or at least once a week, even if it is not full. General tips and recommendations If the alarm sounds:  Stay calm.  Do not turn off the pump or do  anything with the dressing.  Reasons the alarm may go off: ? The battery is low. Change the battery or plug the device into electrical power. ? The dressing has a leak. Find the leak and put tape over the leak. ? The fluid collection container is full. Change the fluid container.  Call your health care provider right away if you cannot fix the problem.  Explain to your health care provider what is happening. Follow his or her instructions. General instructions  Do not turn off the pump unless told to do so by your health care provider.  Do not turn off the pump for more than 2 hours. If the pump is off for more than 2 hours, the dressing will need to be changed.  If your health care provider says it is okay to shower: ? Do not take the pump into the shower. ? Make sure the wound dressing is protected and sealed. The wound dressing must stay dry.  Check frequently that the machine indicates that therapy is on and that all clamps are open.  Do not use over-the-counter medicated or antiseptic creams, sprays, liquids, or dressings unless your health care provider approves. Contact a health care provider if:  You have new pain.  You develop irritation, a rash, or itching around the wound or dressing.  You see new black or yellow tissue in your wound.  The dressing changes are painful or cause bleeding.  The pump has been off for more than 2 hours, and you do not know how to change the dressing.  The pump alarm goes off, and you do not know what to do. Get help right away if:  You have a lot of bleeding.  The wound breaks open.  You have severe pain.  You have signs of infection, such as: ? More redness, swelling, or pain. ? More fluid or blood. ? Warmth. ? Pus or a bad smell. ? Red streaks leading from the wound. ? A fever.  You see a sudden change in the color or texture of the drainage.  You have signs of dehydration, such as: ? Little or no tears, urine, or  sweat. ? Muscle cramps. ? Very dry mouth. ? Headache. ? Dizziness. Summary  Negative pressure wound therapy (NPWT) is a device that helps your wound heal.  Set up a clean station for wound care. Your health care provider will tell you what supplies to use.  Follow your health care provider's instructions on how to clean your wound and how to change the dressing.  Contact a health care provider if you have new pain, an irritation, or a rash, or if the alarm  goes off and you do not know what to do.  Get help right away if you have a lot of bleeding, your wound breaks open, or you have severe pain. Also, get help if you have signs of infection. This information is not intended to replace advice given to you by your health care provider. Make sure you discuss any questions you have with your health care provider. Document Revised: 08/27/2019 Document Reviewed: 09/08/2018 Elsevier Patient Education  2021 [employer] Inc.  AMBULATORY SURGERY  DISCHARGE INSTRUCTIONS   1) The drugs that you were given will stay in your system until tomorrow so for the next 24 hours you should not:  A) Drive an automobile B) Make any legal decisions C) Drink any alcoholic beverage   2) You may resume regular meals tomorrow.  Today it is better to start with liquids and gradually work up to solid foods.  You may eat anything you prefer, but it is better to start with liquids, then soup and crackers, and gradually work up to solid foods.   3) Please notify your doctor immediately if you have any unusual bleeding, trouble breathing, redness and pain at the surgery site, drainage, fever, or pain not relieved by medication.  4) Your post-operative visit with Dr.                                     is: Date:                        Time:    Please call to schedule your post-operative visit.  5) Additional Instructions:  Total Knee Replacement, Care After After the procedure, it is common to have  stiffness and discomfort, or redness, pain, and swelling around your cut from surgery (incision). You may also have a small amount of blood or clear fluid coming from your incision. Follow these instructions at home: Your doctor may give you more instructions. If you have problems, contact your doctor. Medicines  Take over-the-counter and prescription medicines only as told by your doctor.  If you were prescribed a blood thinner, take it as told by your doctor.  If told, take steps to prevent problems with pooping (constipation). You may need to: ? Drink enough fluid to keep your pee (urine) pale yellow. ? Take medicines. You will be told what medicines to take. ? Eat foods that are high in fiber. These include beans, whole grains, and fresh fruits and vegetables. ? Limit foods that are high in fat and sugar. These include fried or sweet foods. Incision care  Follow instructions from your doctor about how to take care of your incision. Make sure you: ? Wash your hands with soap and water for at least 20 seconds before and after you change your bandage. If you cannot use soap and water, use hand sanitizer. ? Change your bandage. ? Leave stitches, staples, or skin glue in place for at least 2 weeks. ? Leave tape strips alone unless you are told to take them off. You may trim the edges of the tape strips if they curl up.  Do not take baths, swim, or use a hot tub until your doctor says it is okay.  Check your incision every day for signs of infection. Check for: ? More redness, swelling, or pain. ? More fluid or blood. ? Warmth. ? Pus or a bad smell.  Activity  Rest as told by your doctor.  Get up to take short walks every 1 to 2 hours. Ask for help if you feel weak or unsteady.  Follow instructions from your doctor about: ? Using a walker, crutches, or a cane. ? How much body weight you may safely support on your affected leg (weight-bearing restrictions). ? How to get out of a  bed and chair and how to go up and down stairs. A physical therapist will show you how to do this.  Do exercises as told by your doctor or physical therapist.  Avoid activities that put stress on your knees. These include running, jumping rope, and doing jumping jacks.  Do not play contact sports until your doctor says it is okay.  Return to your normal activities when your doctor says that it is safe. Managing pain, stiffness, and swelling  If told, put ice on your knee. To do this: ? Put ice in a plastic bag or use an icing device (cold flow pad). Follow your doctor's directions about how to use the icing device. ? Place a towel between your skin and the bag or between your skin and the icing device. ? Leave the ice on for 20 minutes, 2-3 times a day. ? Take off the ice if your skin turns bright red. This is very important. If you cannot feel pain, heat, or cold, you have a greater risk of damage to the area.  Move your toes often.  Raise your leg above the level of your heart while you are sitting or lying down. ? Use a few pillows to keep your leg straight. ? Do not put a pillow just under the knee. If the knee is bent for a long time, this may make the knee stiff.  Wear elastic knee support as told by your doctor.   Safety  To help prevent falls: ? Keep floors clear of objects you may trip over. ? Place items that you may need within easy reach.  Wear an apron or tool belt with pockets for carrying objects. This leaves your hands free to help with your balance.  Do not drive or use machines until your doctor says it is safe.   General instructions  Wear compression stockings as told by your doctor.  Continue with breathing exercises. This helps prevent lung infection.  Do not smoke or use any products that contain nicotine or tobacco. If you need help quitting, ask your doctor.  If you plan to visit a dentist: ? Tell your doctor. Ask about things to do before your teeth  are cleaned. ? Tell your dentist about your new joint.  Keep all follow-up visits. Contact a doctor if:  You have a fever or chills.  You have a cough or feel short of breath.  Your medicine is not controlling your pain.  You have any of these signs of infection around your incision: ? More redness, swelling, or pain. ? More fluid or blood. ? Warmth. ? Pus or a bad smell.  You fall. Get help right away if:  You have very bad pain.  You have trouble breathing.  You have chest pain.  You have pain in your calf or leg.  You have redness, swelling, or warmth in your calf or leg.  Your incision breaks open after the stitches or staples are taken out. These symptoms may be an emergency. Get help right away. Call your local emergency services (911 in the [place]  Do not  wait to see if the symptoms will go away.  Do not drive yourself to the hospital. Summary  After the procedure, it is common to have stiffness and discomfort, or redness, pain, and swelling around your incision.  Follow instructions from your doctor about how to take care of your incision.  Use crutches, a walker, or a cane as told by your doctor.  If you were prescribed a blood thinner, take it as told by your doctor.  Keep all follow-up visits. This information is not intended to replace advice given to you by your health care provider. Make sure you discuss any questions you have with your health care provider. Document Revised: 12/11/2019 Document Reviewed: 12/11/2019 Elsevier Patient Education  2021 [employer].

## 2020-08-05 NOTE — Anesthesia Postprocedure Evaluation (Signed)
Anesthesia Post Note  Patient: Earl Holmes  Procedure(s) Performed: COMPUTER ASSISTED TOTAL KNEE ARTHROPLASTY (Left Knee) APPLICATION OF PERVENA WOUND VAC (Left Knee)  Anesthesia Type: Spinal Anesthetic complications: no Comments: Pt not in hospital.  D/C'd yesterday as outpatient.     No complications documented.   Last Vitals:  Vitals:   08/04/20 1500 08/04/20 1550  BP: 135/74 140/75  Pulse: 77 78  Resp: 18 19  Temp: (!) 36.3 C (!) 36.3 C  SpO2: 95% 97%    Last Pain:  Vitals:   08/04/20 1550  TempSrc: Temporal  PainSc: 0-No pain                 Isaul Landi,  Anneth Brunell R

## 2020-12-21 ENCOUNTER — Emergency Department: Payer: Medicare HMO

## 2020-12-21 ENCOUNTER — Other Ambulatory Visit: Payer: Self-pay

## 2020-12-21 DIAGNOSIS — Z96652 Presence of left artificial knee joint: Secondary | ICD-10-CM | POA: Diagnosis not present

## 2020-12-21 DIAGNOSIS — K219 Gastro-esophageal reflux disease without esophagitis: Secondary | ICD-10-CM | POA: Insufficient documentation

## 2020-12-21 DIAGNOSIS — R0789 Other chest pain: Secondary | ICD-10-CM | POA: Diagnosis not present

## 2020-12-21 DIAGNOSIS — R079 Chest pain, unspecified: Secondary | ICD-10-CM | POA: Diagnosis present

## 2020-12-21 DIAGNOSIS — Z8546 Personal history of malignant neoplasm of prostate: Secondary | ICD-10-CM | POA: Diagnosis not present

## 2020-12-21 DIAGNOSIS — Z79899 Other long term (current) drug therapy: Secondary | ICD-10-CM | POA: Diagnosis not present

## 2020-12-21 DIAGNOSIS — I10 Essential (primary) hypertension: Secondary | ICD-10-CM | POA: Insufficient documentation

## 2020-12-21 LAB — BASIC METABOLIC PANEL
Anion gap: 9 (ref 5–15)
BUN: 15 mg/dL (ref 8–23)
CO2: 26 mmol/L (ref 22–32)
Calcium: 9.5 mg/dL (ref 8.9–10.3)
Chloride: 103 mmol/L (ref 98–111)
Creatinine, Ser: 0.86 mg/dL (ref 0.61–1.24)
GFR, Estimated: >60 mL/min (ref >60)
Glucose, Bld: 120 mg/dL — ABNORMAL HIGH (ref 70–99)
Potassium: 3.3 mmol/L — ABNORMAL LOW (ref 3.5–5.1)
Sodium: 138 mmol/L (ref 135–145)

## 2020-12-21 LAB — CBC
HCT: 41.8 % (ref 39.0–52.0)
Hemoglobin: 15.5 g/dL (ref 13.0–17.0)
MCH: 33 pg (ref 26.0–34.0)
MCHC: 37.1 g/dL — ABNORMAL HIGH (ref 30.0–36.0)
MCV: 89.1 fL (ref 80.0–100.0)
Platelets: 177 10*3/uL (ref 150–400)
RBC: 4.69 MIL/uL (ref 4.22–5.81)
RDW: 13.2 % (ref 11.5–15.5)
WBC: 6.7 10*3/uL (ref 4.0–10.5)
nRBC: 0 % (ref 0.0–0.2)

## 2020-12-21 LAB — TROPONIN I (HIGH SENSITIVITY): Troponin I (High Sensitivity): 14 ng/L (ref <18)

## 2020-12-21 NOTE — ED Notes (Signed)
PLEASE CALL PT'S DAUGHTER JENNIFER AT [phone] WHEN SHE CAN JOIN PT IN TREATMENT ROOM.

## 2020-12-21 NOTE — ED Notes (Signed)
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 190/86, 76, pox 96% on ra. Pt received 324mg  of asa, pain per ems currently 0/10.

## 2020-12-21 NOTE — ED Triage Notes (Signed)
Intermittent L nonradiating cp since this afternoon, was going to drive here and then got dizzy so ems, was given asa

## 2020-12-22 ENCOUNTER — Emergency Department
Admission: EM | Admit: 2020-12-22 | Discharge: 2020-12-22 | Disposition: A | Discharge status: Home / Self Care | Payer: Medicare HMO | Attending: Emergency Medicine | Admitting: Emergency Medicine

## 2020-12-22 ENCOUNTER — Emergency Department: Payer: Medicare HMO

## 2020-12-22 DIAGNOSIS — R079 Chest pain, unspecified: Secondary | ICD-10-CM

## 2020-12-22 LAB — HEPATIC FUNCTION PANEL
ALT: 23 U/L (ref 0–44)
AST: 23 U/L (ref 15–41)
Albumin: 4.3 g/dL (ref 3.5–5.0)
Alkaline Phosphatase: 48 U/L (ref 38–126)
Bilirubin, Direct: <0.1 mg/dL (ref 0.0–0.2)
Total Bilirubin: 0.9 mg/dL (ref 0.3–1.2)
Total Protein: 6.9 g/dL (ref 6.5–8.1)

## 2020-12-22 LAB — TROPONIN I (HIGH SENSITIVITY): Troponin I (High Sensitivity): 17 ng/L (ref <18)

## 2020-12-22 LAB — D-DIMER, QUANTITATIVE: D-Dimer, Quant: 1.12 ug/mL-FEU — ABNORMAL HIGH (ref 0.00–0.50)

## 2020-12-22 LAB — LIPASE, BLOOD: Lipase: 27 U/L (ref 11–51)

## 2020-12-22 MED ORDER — IOHEXOL 350 MG/ML SOLN
100.0000 mL | Freq: Once | INTRAVENOUS | Status: AC | PRN
  Administered 2020-12-22: 100 mL via INTRAVENOUS

## 2020-12-22 MED ORDER — ACETAMINOPHEN 500 MG PO TABS
1000.0000 mg | ORAL_TABLET | Freq: Once | ORAL | Status: AC
  Administered 2020-12-22: 1000 mg via ORAL
  Filled 2020-12-22: qty 2

## 2020-12-22 MED ORDER — LIDOCAINE 5 % EX PTCH
1.0000 | MEDICATED_PATCH | CUTANEOUS | Status: DC
  Administered 2020-12-22: 1 via TRANSDERMAL
  Filled 2020-12-22: qty 1

## 2020-12-22 NOTE — ED Notes (Signed)
AAOx3.  Skin warm and dry.  NAD 

## 2020-12-22 NOTE — ED Provider Notes (Signed)
[facility] Emergency Department Provider Note  ____________________________________________  Time seen: Approximately 6:47 AM  I have reviewed the triage vital signs and the nursing notes.   HISTORY  Chief Complaint Chest Pain   HPI Earl Holmes is a 80 y.o. male with a history of BPH, GERD, hypertension, hyperlipidemia, syncope, vertigo who presents for evaluation of chest pain.  Patient reports that the pain started yesterday evening.  He describes the pain as a dull ache on the left side of his chest.  The pain is intermittent lasting seconds to up to 20 minutes at a time.  Patient denies any shortness of breath or cough.  He denies fever or chills.  He denies personal or family history of blood clots, recent travel immobilization, leg pain or swelling, hemoptysis or exogenous hormones.  He denies any history of heart disease but his father had history of heart disease.  He is not a smoker.  He denies abdominal pain, vomiting or diarrhea.  Denies syncope.  Patient is currently moderate in intensity.  Patient received 324 mg of aspirin per EMS.  Past Medical History:  Diagnosis Date   Balanoposthitis    BPH (benign prostatic hyperplasia)    Degenerative arthritis of left knee    Eczema    Enlarged prostate with lower urinary tract symptoms (LUTS)    GERD (gastroesophageal reflux disease)    Hyperlipidemia    Hypertension    Hypogonadism in male    Paraphimosis    Penile rash    Phimosis    Polycythemia    Psoriasis    Syncope and collapse    started last fall and last time passed out was 12/20, MRI and stress test done which were negative. "Thinks my blood pressure was dropping"   Vertigo    major episode 07/10/17.  other minor since.  Currently having some issues.    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   Urge incontinence 05/02/2012    Past Surgical History:  Procedure Laterality Date   APPLICATION OF WOUND VAC Left 08/04/2020   Procedure: APPLICATION OF PERVENA WOUND VAC;  Surgeon: Donato Heinz, MD;  Location: [facility] ORS;  Service: Orthopedics;  Laterality: Left;   CATARACT EXTRACTION W/PHACO Right 07/26/2018   Procedure: CATARACT EXTRACTION PHACO AND INTRAOCULAR LENS PLACEMENT (IOC)  RIGHT;  Surgeon: Lockie Mola, MD;  Location: [facility];  Service: Ophthalmology;  Laterality: Right;   CATARACT EXTRACTION W/PHACO Left 09/12/2018   Procedure: CATARACT EXTRACTION PHACO AND INTRAOCULAR LENS PLACEMENT (IOC) LEFT;  Surgeon: Lockie Mola, MD;  Location: [facility];  Service: Ophthalmology;  Laterality: Left;   COLONOSCOPY N/A 05/19/2015   Procedure: COLONOSCOPY;  Surgeon: Wallace Cullens, MD;  Location: [facility];  Service: Gastroenterology;  Laterality: N/A;   COLONOSCOPY WITH PROPOFOL N/A 11/22/2019   Procedure: COLONOSCOPY WITH PROPOFOL;  Surgeon: Sung Amabile, DO;  Location: [facility] ENDOSCOPY;  Service: General;  Laterality: N/A;   EYE SURGERY  HERNIA REPAIR Left    inguinal   JOINT REPLACEMENT     KNEE ARTHROPLASTY Left 08/04/2020   Procedure: COMPUTER ASSISTED TOTAL KNEE ARTHROPLASTY;  Surgeon: Donato Heinz, MD;  Location: [facility] ORS;  Service: Orthopedics;  Laterality: Left;   KNEE SURGERY Right 2003   partial knee replacement    Prior to Admission medications   Medication Sig Start Date End Date Taking? Authorizing  Provider  amLODipine (NORVASC) 10 MG tablet Take 10 mg by mouth daily. 07/30/14   [provider]  Ascorbic Acid (VITAMIN C) 1000 MG tablet Take 1,000 mg by mouth daily.    [provider]  Azelaic Acid 15 % cream Apply 1 application topically daily. 07/08/20   [provider]  celecoxib (CELEBREX) 200 MG capsule Take 1 capsule (200 mg total) by mouth 2 (two) times daily. 08/04/20 08/04/21  Hooten, Illene Labrador, MD  Cholecalciferol (VITAMIN D3) 125 MCG (5000 UT) TABS Take 5,000 Units by mouth daily.    [provider]  doxycycline (VIBRAMYCIN) 100 MG capsule Take 100 mg by mouth daily. 07/08/20   [provider]  enoxaparin (LOVENOX) 40 MG/0.4ML injection Inject 0.4 mLs (40 mg total) into the skin daily. 08/05/20   Hooten, Illene Labrador, MD  eplerenone (INSPRA) 25 MG tablet Take 25 mg by mouth daily. 07/09/20   [provider]  EUCRISA 2 % OINT Apply 1 application topically 2 (two) times daily. 05/15/20   [provider]  finasteride (PROSCAR) 5 MG tablet Take 5 mg by mouth daily. 09/10/13   [provider]  hydrochlorothiazide (HYDRODIURIL) 25 MG tablet Take 25 mg by mouth daily. 12/16/14   [provider]  losartan (COZAAR) 50 MG tablet Take 50 mg by mouth at bedtime. 10/07/14   [provider]  Multiple Vitamins-Minerals (CENTRUM ULTRA MENS) TABS Take 1 tablet by mouth daily.    [provider]  Omega-3 Fatty Acids (FISH OIL) 1000 MG CAPS Take 1,000 mg by mouth daily.    [provider]  oxyCODONE (ROXICODONE) 5 MG immediate release tablet Take 1-2 tablets (5-10 mg total) by mouth every 4 (four) hours as needed for severe pain. 08/04/20   Hooten, Illene Labrador, MD  vitamin E 1000 UNIT capsule Take 1,000 Units by mouth daily.    [provider]    Allergies Amoxicillin-pot clavulanate, Codeine sulfate, and Penicillin v potassium [penicillin v]  Family History  Problem Relation Age of Onset   Heart disease  Father    Stroke Mother    Heart disease Brother    Liver disease Brother    Colon cancer Brother    Bone cancer Brother    Colon polyps Brother    Liver disease Sister    Colon polyps Sister    Prostate cancer Neg Hx    Bladder Cancer Neg Hx    Kidney disease Neg Hx     Social History Social History   Tobacco Use   Smoking status: Never   Smokeless tobacco: Never  Vaping Use   Vaping Use: Never used  Substance Use Topics   Alcohol use: No    Alcohol/week: 0.0 standard drinks   Drug use: No    Review of Systems  Constitutional: Negative for fever. Eyes: Negative for visual changes. ENT: Negative for sore throat. Neck: No neck pain  Cardiovascular: + L sided chest pain. Respiratory: Negative for shortness of breath. Gastrointestinal: Negative for abdominal pain, vomiting or diarrhea. Genitourinary: Negative for dysuria. Musculoskeletal: Negative for back  pain. Skin: Negative for rash. Neurological: Negative for headaches, weakness or numbness. Psych: No SI or HI  ____________________________________________   PHYSICAL EXAM:  VITAL SIGNS: Vitals:   12/22/20 0419 12/22/20 0543  BP: (!) 157/73 (!) 142/73  Pulse: 72 68  Resp: 18 16  Temp:    SpO2: 95% 95%    Constitutional: Alert and oriented. Well appearing and in no apparent distress. HEENT:      Head: Normocephalic and atraumatic.         Eyes: Conjunctivae are normal. Sclera is non-icteric.       Mouth/Throat: Mucous membranes are moist.       Neck: Supple with no signs of meningismus. Cardiovascular: Regular rate and rhythm. No murmurs, gallops, or rubs. 2+ symmetrical distal pulses are present in all extremities. No JVD. Respiratory: Normal respiratory effort. Lungs are clear to auscultation bilaterally.  Gastrointestinal: Soft, non tender, and non distended with positive bowel sounds. No rebound or guarding. Genitourinary: No CVA tenderness. Musculoskeletal:  No edema, cyanosis, or erythema of  extremities. Neurologic: Normal speech and language. Face is symmetric. Moving all extremities. No gross focal neurologic deficits are appreciated. Skin: Skin is warm, dry and intact. No rash noted. Psychiatric: Mood and affect are normal. Speech and behavior are normal.  ____________________________________________   LABS (all labs ordered are listed, but only abnormal results are displayed)  Labs Reviewed  BASIC METABOLIC PANEL - Abnormal; Notable for the following components:      Result Value   Potassium 3.3 (*)    Glucose, Bld 120 (*)    All other components within normal limits  CBC - Abnormal; Notable for the following components:   MCHC 37.1 (*)    All other components within normal limits  D-DIMER, QUANTITATIVE - Abnormal; Notable for the following components:   D-Dimer, Quant 1.12 (*)    All other components within normal limits  HEPATIC FUNCTION PANEL  LIPASE, BLOOD  TROPONIN I (HIGH SENSITIVITY)  TROPONIN I (HIGH SENSITIVITY)   ____________________________________________  EKG  ED ECG REPORT I, Nita Sicklearolina Rayley Gao, the attending physician, personally viewed and interpreted this ECG.  Sinus rhythm with a rate of 73, intraventricular conduction delay, borderline QTC, no concordant ST elevations.  Unchanged from prior from 2/22. ____________________________________________  RADIOLOGY  I have personally reviewed the images performed during this visit and I agree with the Radiologist's read.   Interpretation by Radiologist:  DG Chest 2 View  Result Date: 12/21/2020 CLINICAL DATA:  Chest pain for several months EXAM: CHEST - 2 VIEW COMPARISON:  12/18/14 FINDINGS: Cardiac shadow is within normal limits. The lungs are well aerated bilaterally. No focal infiltrate or sizable effusion is seen. No bony abnormality is noted. IMPRESSION: No acute abnormality seen. Electronically Signed   By: Alcide CleverMark  Lukens M.D.   On: 12/21/2020 22:13   CT Angio Chest/Abd/Pel for Dissection W  and/or Wo Contrast  Result Date: 12/22/2020 CLINICAL DATA:  80 year old male with intermittent chest pain since yesterday. Dizziness. EXAM: CT ANGIOGRAPHY CHEST, ABDOMEN AND PELVIS TECHNIQUE: Non-contrast CT of the chest was initially obtained. Multidetector CT imaging through the chest, abdomen and pelvis was performed using the standard protocol during bolus administration of intravenous contrast. Multiplanar reconstructed images and MIPs were obtained and reviewed to evaluate the vascular anatomy. CONTRAST:  100mL OMNIPAQUE IOHEXOL 350 MG/ML SOLN COMPARISON:  Noncontrast chest CT 12/18/2014, 09/23/2014. Chest radiograph 12/21/2020. FINDINGS: CTA CHEST FINDINGS Cardiovascular: Faint calcified coronary artery atherosclerosis on series 4, image 37. No cardiomegaly or pericardial effusion. Mild Calcified  aortic atherosclerosis. No thoracic aortic dissection or aneurysm. Little pulmonary artery enhancement on this exam. Mediastinum/Nodes: Negative.  No lymphadenopathy. Lungs/Pleura: Chronic right lower lobe lung nodule on series 6 image 133 is stable since 2016 and benign. Larger lung volumes compared to prior CTs. Major airways are patent. Minor dependent atelectasis. Small calcified granuloma in the right costophrenic angle with regressed lung opacity there since 2016, benign. Occasional mild subpleural scarring in both lungs. No pleural effusion. Musculoskeletal: Negative for age. No acute osseous abnormality identified. Review of the MIP images confirms the above findings. CTA ABDOMEN AND PELVIS FINDINGS VASCULAR Mild for age Aortoiliac calcified atherosclerosis. Negative for abdominal aortic aneurysm or dissection. Major aortic branches are patent. Mildly tortuous iliac arteries. Proximal femoral arteries appear patent and negative. Review of the MIP images confirms the above findings. NON-VASCULAR Hepatobiliary: Hepatic steatosis. Occasional punctate calcified liver granulomas. A subtle small low-density area  in the liver dome on series 4, image 58 was present in 2016 and most likely a benign cyst. Negative gallbladder. Pancreas: Negative. Spleen: Negative. Adrenals/Urinary Tract: Normal adrenal glands. Symmetric renal enhancement. Negative kidneys. Decompressed ureters. Mildly distended but otherwise unremarkable urinary bladder. Stomach/Bowel: Redundant large bowel with retained stool. Diminutive or absent appendix. No large bowel inflammation. Negative terminal ileum. No dilated small bowel. Decompressed stomach and duodenum. No free air, free fluid or mesenteric inflammation. Lymphatic: No lymphadenopathy. Reproductive: Sequelae of left inguinal hernia repair with no adverse features. Prostatomegaly. Otherwise negative. Other: No pelvic free fluid. Musculoskeletal: Mild lower lumbar spondylolisthesis with facet arthropathy. Vacuum disc at the lumbosacral junction. No acute osseous abnormality identified. Review of the MIP images confirms the above findings. IMPRESSION: 1. Negative for aortic dissection or aneurysm. Mild for age Aortic Atherosclerosis (ICD10-I70.0). Faint calcified coronary artery atherosclerosis. 2. No acute or inflammatory process identified in the chest, abdomen, or pelvis. Benign right lower lobe lung nodule stable since 2016. Hepatic steatosis. Prior left inguinal hernia repair. Electronically Signed   By: Odessa Fleming M.D.   On: 12/22/2020 06:57     ____________________________________________   PROCEDURES  Procedure(s) performed: yes .1-3 Lead EKG Interpretation  Date/Time: 12/22/2020 6:51 AM Performed by: Nita Sickle, MD Authorized by: Nita Sickle, MD     Interpretation: abnormal     ECG rate assessment: normal     Rhythm: sinus rhythm     Ectopy: none     Conduction: abnormal     Critical Care performed:  None ____________________________________________   INITIAL IMPRESSION / ASSESSMENT AND PLAN / ED COURSE  80 y.o. male with a history of BPH, GERD,  hypertension, hyperlipidemia, syncope, vertigo who presents for evaluation of chest pain.  Patient presenting with intermittent soreness of the left chest.  Pain is not reproducible on palpation, abdomen is soft and nontender, there is no rash, pulses are equal and strong in all 4 extremities.  Differential diagnosis including ACS versus GERD versus hiatal hernia versus indigestion versus MSK pain versus PE versus dissection versus shingles   EKG unchanged from baseline.  2 high-sensitivity troponins were negative.  D-dimer was elevated therefore patient was sent for a CT angio of his chest.  Chest x-Swango visualized by me with no acute findings, confirmed by radiology.  Labs including LFTs and lipase are within normal limits.  No leukocytosis.  Mildly elevated glucose of 120 which was discussed with patient and recommended fasting blood glucose.  Patient was given a lidocaine patch and of 1000 g of Tylenol for pain.  Had already received 325 mg of aspirin  per EMS.  _________________________ 7:07 AM on 12/22/2020 ----------------------------------------- CT of the chest negative for PE or dissection.  Does show mild coronary calcifications.  Discussed these findings with patient and recommended follow-up with his cardiologist.    _____________________________________________ Please note:  Patient was evaluated in Emergency Department today for the symptoms described in the history of present illness. Patient was evaluated in the context of the global COVID-19 pandemic, which necessitated consideration that the patient might be at risk for infection with the SARS-CoV-2 virus that causes COVID-19. Institutional protocols and algorithms that pertain to the evaluation of patients at risk for COVID-19 are in a state of rapid change based on information released by regulatory bodies including the CDC and federal and state organizations. These policies and algorithms were followed during the patient's care in  the ED.  Some ED evaluations and interventions may be delayed as a result of limited staffing during the pandemic.   [place] Controlled Substance Database was reviewed by me. ____________________________________________   FINAL CLINICAL IMPRESSION(S) / ED DIAGNOSES   Final diagnoses:  Chest pain, unspecified type      NEW MEDICATIONS STARTED DURING THIS VISIT:  ED Discharge Orders     None        Note:  This document was prepared using Dragon voice recognition software and may include unintentional dictation errors.    Don Perking, [place], MD 12/22/20 [phone]

## 2020-12-22 NOTE — Discharge Instructions (Addendum)

## 2020-12-22 NOTE — ED Provider Notes (Signed)
Emergency Medicine Provider Triage Evaluation Note  Earl Holmes , a 80 y.o. male  was evaluated in triage.  Pt complains of chest pain. Left chest/rib pain onset yesterday evening.  Review of Systems  Positive: Chest pain Negative: Sob, n/v, dizziness  Physical Exam  BP (!) 172/81 (BP Location: Left Arm)   Pulse 73   Temp 98 F (36.7 C) (Oral)   Resp 16   Ht 6' (1.829 m)   Wt 89.4 kg   SpO2 94%   BMI 26.72 kg/m  Gen:   Awake, no distress   Resp:  Normal effort  MSK:   Moves extremities without difficulty  Other:  RRR  Medical Decision Making  Medically screening exam initiated at 12:55 AM.  Appropriate orders placed.  Al Corpus Dorman was informed that the remainder of the evaluation will be completed by another provider, this initial triage assessment does not replace that evaluation, and the importance of remaining in the ED until their evaluation is complete.  80 year old male presenting with chest pain. Initial troponin WNL. Given location of pain, will add LFTs/lipase. Awaiting second troponin. Currently no chest pain.   Irean Hong, MD 12/22/20 [phone]

## 2021-06-17 ENCOUNTER — Other Ambulatory Visit: Payer: Self-pay

## 2021-06-17 ENCOUNTER — Encounter: Payer: Self-pay | Admitting: Urology

## 2021-06-17 ENCOUNTER — Ambulatory Visit: Payer: Medicare HMO | Admitting: Urology

## 2021-06-17 VITALS — BP 159/74 | HR 76 | Ht 72.0 in | Wt 198.0 lb

## 2021-06-17 DIAGNOSIS — R361 Hematospermia: Secondary | ICD-10-CM | POA: Diagnosis not present

## 2021-06-17 NOTE — Progress Notes (Signed)
06/17/2021 10:28 AM   Earl Holmes 12/31/1940 [id number]  Referring provider: Marina Goodell, MD [address] [place],  [place] [address]  Chief Complaint  Patient presents with   Other    HPI: Earl Holmes is an 80 y.o. male who presents for evaluation of hematospermia.  Approximately 1 month ago he was masturbating and states he had to void about an hour later and noted fresh blood in his underwear.  No gross hematuria Approximately 2 weeks later he noted a few small blood spots in his underwear after masturbation No pain with ejaculation No bothersome LUTS Prior history of an elevated PSA status post prostate biopsy by Dr. Achilles Dunk sometime before 2014.  Biopsy showed focal high-grade PIN He was started on finasteride PSA has been checked regularly by PCP and was stable at 0.47 in October 2022   PMH: Past Medical History:  Diagnosis Date   Balanoposthitis    BPH (benign prostatic hyperplasia)    Degenerative arthritis of left knee    Eczema    Enlarged prostate with lower urinary tract symptoms (LUTS)    GERD (gastroesophageal reflux disease)    Hyperlipidemia    Hypertension    Hypogonadism in male    Paraphimosis    Penile rash    Phimosis    Polycythemia    Psoriasis    Syncope and collapse    started last fall and last time passed out was 12/20, MRI and stress test done which were negative. "Thinks my blood pressure was dropping"   Vertigo    major episode 07/10/17.  other minor since.  Currently having some issues.    Surgical History: Past Surgical History:  Procedure Laterality Date   APPLICATION OF WOUND VAC Left 08/04/2020   Procedure: APPLICATION OF PERVENA WOUND VAC;  Surgeon: Donato Heinz, MD;  Location: [facility];  Service: Orthopedics;  Laterality: Left;   CATARACT EXTRACTION W/PHACO Right 07/26/2018   Procedure: CATARACT EXTRACTION PHACO AND INTRAOCULAR LENS PLACEMENT (IOC)  RIGHT;  Surgeon: Lockie Mola, MD;  Location: [facility];  Service: Ophthalmology;  Laterality: Right;   CATARACT EXTRACTION W/PHACO Left 09/12/2018   Procedure: CATARACT EXTRACTION PHACO AND INTRAOCULAR LENS PLACEMENT (IOC) LEFT;  Surgeon: Lockie Mola, MD;  Location: [facility];  Service: Ophthalmology;  Laterality: Left;   COLONOSCOPY N/A 05/19/2015   Procedure: COLONOSCOPY;  Surgeon: Wallace Cullens, MD;  Location: [facility];  Service: Gastroenterology;  Laterality: N/A;   COLONOSCOPY WITH PROPOFOL N/A 11/22/2019   Procedure: COLONOSCOPY WITH PROPOFOL;  Surgeon: Sung Amabile, DO;  Location: [facility] ENDOSCOPY;  Service: General;  Laterality: N/A;   EYE SURGERY     HERNIA REPAIR Left    inguinal   JOINT REPLACEMENT     KNEE ARTHROPLASTY Left 08/04/2020   Procedure: COMPUTER ASSISTED TOTAL KNEE ARTHROPLASTY;  Surgeon: Donato Heinz, MD;  Location: [facility] ORS;  Service: Orthopedics;  Laterality: Left;   KNEE SURGERY Right 2003   partial knee replacement    Home Medications:  Allergies as of 06/17/2021       Reactions   Amoxicillin-pot Clavulanate Other (See Comments)   Joint Pain (severe myalgia) in siblings- unknown in pt   Codeine Sulfate Rash   Penicillin V Potassium [penicillin V]    Siblings had severe myalgia when taking augmentin or PCN; unknown to patient        Medication List        Accurate as of June 17, 2021 10:28 AM. If  you have any questions, ask your nurse or doctor.          STOP taking these medications    oxyCODONE 5 MG immediate release tablet Commonly known as: Roxicodone Stopped by: Abbie Sons, MD       TAKE these medications    amLODipine 10 MG tablet Commonly known as: NORVASC Take 10 mg by mouth daily.   Azelaic Acid 15 % gel Apply 1 application topically daily.   celecoxib 200 MG capsule Commonly known as: CeleBREX Take 1 capsule (200 mg total) by mouth 2 (two) times daily.   Centrum Ultra Mens Tabs Take 1 tablet by mouth daily.   doxycycline 100 MG  capsule Commonly known as: VIBRAMYCIN Take 100 mg by mouth daily.   enoxaparin 40 MG/0.4ML injection Commonly known as: LOVENOX Inject 0.4 mLs (40 mg total) into the skin daily.   eplerenone 25 MG tablet Commonly known as: INSPRA Take 25 mg by mouth daily.   Eucrisa 2 % Oint Generic drug: Crisaborole Apply 1 application topically 2 (two) times daily.   finasteride 5 MG tablet Commonly known as: PROSCAR Take 5 mg by mouth daily.   Fish Oil 1000 MG Caps Take 1,000 mg by mouth daily.   hydrochlorothiazide 25 MG tablet Commonly known as: HYDRODIURIL Take 25 mg by mouth daily.   losartan 50 MG tablet Commonly known as: COZAAR Take 50 mg by mouth at bedtime.   vitamin C 1000 MG tablet Take 1,000 mg by mouth daily.   Vitamin D3 125 MCG (5000 UT) Tabs Take 5,000 Units by mouth daily.   vitamin E 1000 UNIT capsule Take 1,000 Units by mouth daily.        Allergies:  Allergies  Allergen Reactions   Amoxicillin-Pot Clavulanate Other (See Comments)    Joint Pain (severe myalgia) in siblings- unknown in pt   Codeine Sulfate Rash   Penicillin V Potassium [Penicillin V]     Siblings had severe myalgia when taking augmentin or PCN; unknown to patient    Family History: Family History  Problem Relation Age of Onset   Heart disease Father    Stroke Mother    Heart disease Brother    Liver disease Brother    Colon cancer Brother    Bone cancer Brother    Colon polyps Brother    Liver disease Sister    Colon polyps Sister    Prostate cancer Neg Hx    Bladder Cancer Neg Hx    Kidney disease Neg Hx     Social History:  reports that he has never smoked. He has never used smokeless tobacco. He reports that he does not drink alcohol and does not use drugs.   Physical Exam: BP (!) 159/74    Pulse 76    Ht 6' (1.829 m)    Wt 198 lb (89.8 kg)    BMI 26.85 kg/m   Constitutional:  Alert and oriented, No acute distress. HEENT: [place] AT, moist mucus membranes.  Trachea  midline, no masses. Cardiovascular: No clubbing, cyanosis, or edema. Respiratory: Normal respiratory effort, no increased work of breathing. GU: Prostate 40 g, smooth without nodules Psychiatric: Normal mood and affect.   Assessment & Plan:    1.  Hematospermia Isolated episode We discussed hematospermia is common and generally a self-limiting benign condition most likely related to BPH Recent PSA is low and stable and benign DRE supporting benign etiology Should he have recurrent episodes of active hematospermia he was instructed to call  we can investigate further   Abbie Sons, MD  [facility] [address] [place], [place] [address] [phone]

## 2021-07-08 ENCOUNTER — Ambulatory Visit: Payer: Medicare HMO | Admitting: Urology

## 2021-07-08 ENCOUNTER — Encounter: Payer: Self-pay | Admitting: Urology

## 2021-07-08 ENCOUNTER — Other Ambulatory Visit: Payer: Self-pay

## 2021-07-08 VITALS — BP 144/72 | HR 80 | Ht 72.0 in | Wt 198.0 lb

## 2021-07-08 DIAGNOSIS — N476 Balanoposthitis: Secondary | ICD-10-CM | POA: Diagnosis not present

## 2021-07-08 DIAGNOSIS — R361 Hematospermia: Secondary | ICD-10-CM

## 2021-07-08 LAB — URINALYSIS, COMPLETE
Bilirubin, UA: NEGATIVE
Glucose, UA: NEGATIVE
Leukocytes,UA: NEGATIVE
Nitrite, UA: NEGATIVE
Protein,UA: NEGATIVE
RBC, UA: NEGATIVE
Specific Gravity, UA: 1.015 (ref 1.005–1.030)
Urobilinogen, Ur: 0.2 mg/dL (ref 0.2–1.0)
pH, UA: 7 (ref 5.0–7.5)

## 2021-07-08 LAB — MICROSCOPIC EXAMINATION: Bacteria, UA: NONE SEEN

## 2021-07-08 MED ORDER — NYSTATIN-TRIAMCINOLONE 100000-0.1 UNIT/GM-% EX OINT: 1.0000 "application " | TOPICAL_OINTMENT | Freq: Two times a day (BID) | CUTANEOUS | 0 refills | Status: AC

## 2021-07-08 NOTE — Patient Instructions (Signed)

## 2021-07-08 NOTE — Progress Notes (Signed)
07/08/2021 9:33 AM   Earl Holmes 09-12-1940 [id number]  Referring provider: Sofie Hartigan, MD [place],  [place] [address]  Chief Complaint  Patient presents with   Other    HPI: 81 y.o. male presents for follow-up of recurrent hematospermia.  Initially seen 06/17/2021 for hematospermia He states he has had 3 additional episodes since last visit.  He will have blood per urethra after ejaculation and spotting of his underwear He also has postvoid dribbling and has irritation on the ventral aspect of his glans.  He is applying a OTC topical antifungal with improvement but not resolution Denies gross hematuria   PMH: Past Medical History:  Diagnosis Date   Balanoposthitis    BPH (benign prostatic hyperplasia)    Degenerative arthritis of left knee    Eczema    Enlarged prostate with lower urinary tract symptoms (LUTS)    GERD (gastroesophageal reflux disease)    Hyperlipidemia    Hypertension    Hypogonadism in male    Paraphimosis    Penile rash    Phimosis    Polycythemia    Psoriasis    Syncope and collapse    started last fall and last time passed out was 12/20, MRI and stress test done which were negative. "Thinks my blood pressure was dropping"   Vertigo    major episode 07/10/17.  other minor since.  Currently having some issues.    Surgical History: Past Surgical History:  Procedure Laterality Date   APPLICATION OF WOUND VAC Left 08/04/2020   Procedure: APPLICATION OF PERVENA WOUND VAC;  Surgeon: Dereck Leep, MD;  Location: [facility];  Service: Orthopedics;  Laterality: Left;   CATARACT EXTRACTION W/PHACO Right 07/26/2018   Procedure: CATARACT EXTRACTION PHACO AND INTRAOCULAR LENS PLACEMENT ([place])  RIGHT;  Surgeon: Leandrew Koyanagi, MD;  Location: [facility];  Service: Ophthalmology;  Laterality: Right;   CATARACT EXTRACTION W/PHACO Left 09/12/2018   Procedure: CATARACT EXTRACTION PHACO AND INTRAOCULAR LENS PLACEMENT ([place])  LEFT;  Surgeon: Leandrew Koyanagi, MD;  Location: [facility] [place];  Service: Ophthalmology;  Laterality: Left;   COLONOSCOPY N/A 05/19/2015   Procedure: COLONOSCOPY;  Surgeon: Hulen Luster, MD;  Location[facility];  Service: Gastroenterology;  Laterality: N/A;   COLONOSCOPY WITH PROPOFOL N/A 11/22/2019   Procedure: COLONOSCOPY WITH PROPOFOL;  Surgeon: Benjamine Sprague, DO;  Location: [facility];  Service: General;  Laterality: N/A;   EYE SURGERY     HERNIA REPAIR Left    inguinal   JOINT REPLACEMENT     KNEE ARTHROPLASTY Left 08/04/2020   Procedure: COMPUTER ASSISTED TOTAL KNEE ARTHROPLASTY;  Surgeon: Dereck Leep, MD;  Location: [facility];  Service: Orthopedics;  Laterality: Left;   KNEE SURGERY Right 2003   partial knee replacement    Home Medications:  Allergies as of 07/08/2021       Reactions   Amoxicillin-pot Clavulanate Other (See Comments)   Joint Pain (severe myalgia) in siblings- unknown in pt   Codeine Sulfate Rash   Penicillin V Potassium [penicillin V]    Siblings had severe myalgia when taking augmentin or PCN; unknown to patient        Medication List        Accurate as of July 08, 2021  9:33 AM. If you have any questions, ask your nurse or doctor.          amLODipine 10 MG tablet Commonly known as: NORVASC Take 10 mg by mouth daily.   Azelaic  Acid 15 % gel Apply 1 application topically daily.   celecoxib 200 MG capsule Commonly known as: CeleBREX Take 1 capsule (200 mg total) by mouth 2 (two) times daily.   Centrum Ultra Mens Tabs Take 1 tablet by mouth daily.   doxycycline 100 MG capsule Commonly known as: VIBRAMYCIN Take 100 mg by mouth daily.   enoxaparin 40 MG/0.4ML injection Commonly known as: LOVENOX Inject 0.4 mLs (40 mg total) into the skin daily.   eplerenone 25 MG tablet Commonly known as: INSPRA Take 25 mg by mouth daily.   Eucrisa 2 % Oint Generic drug: Crisaborole Apply 1 application topically 2 (two) times  daily.   finasteride 5 MG tablet Commonly known as: PROSCAR Take 5 mg by mouth daily.   Fish Oil 1000 MG Caps Take 1,000 mg by mouth daily.   hydrochlorothiazide 25 MG tablet Commonly known as: HYDRODIURIL Take 25 mg by mouth daily.   losartan 50 MG tablet Commonly known as: COZAAR Take 50 mg by mouth at bedtime.   vitamin C 1000 MG tablet Take 1,000 mg by mouth daily.   Vitamin D3 125 MCG (5000 UT) Tabs Take 5,000 Units by mouth daily.   vitamin E 1000 UNIT capsule Take 1,000 Units by mouth daily.        Allergies:  Allergies  Allergen Reactions   Amoxicillin-Pot Clavulanate Other (See Comments)    Joint Pain (severe myalgia) in siblings- unknown in pt   Codeine Sulfate Rash   Penicillin V Potassium [Penicillin V]     Siblings had severe myalgia when taking augmentin or PCN; unknown to patient    Family History: Family History  Problem Relation Age of Onset   Heart disease Father    Stroke Mother    Heart disease Brother    Liver disease Brother    Colon cancer Brother    Bone cancer Brother    Colon polyps Brother    Liver disease Sister    Colon polyps Sister    Prostate cancer Neg Hx    Bladder Cancer Neg Hx    Kidney disease Neg Hx     Social History:  reports that he has never smoked. He has never used smokeless tobacco. He reports that he does not drink alcohol and does not use drugs.   Physical Exam: BP (!) 144/72    Pulse 80    Ht 6' (1.829 m)    Wt 198 lb (89.8 kg)    BMI 26.85 kg/m   Constitutional:  Alert and oriented, No acute distress. HEENT: [place] AT, moist mucus membranes.  Trachea midline, no masses. Cardiovascular: No clubbing, cyanosis, or edema. Respiratory: Normal respiratory effort, no increased work of breathing. GI: Abdomen is soft, nontender, nondistended, no abdominal masses GU: Phallus-prepuce easily retracts.  Mild erythema ventral glans Neurologic: Grossly intact, no focal deficits, moving all 4 extremities. Psychiatric:  Normal mood and affect.  Laboratory Data:  Urinalysis Dipstick/microscopy negative   Assessment & Plan:    1. Hematospermia Recurrent; blood per urethra after ejaculation Schedule pelvic CT and cystoscopy   2.  Balanoposthitis Rx nystatin/triamcinolone cream sent to [facility] [place] [address], [place], [place] [address] [phone]

## 2021-07-27 ENCOUNTER — Ambulatory Visit
Admission: RE | Admit: 2021-07-27 | Discharge: 2021-07-27 | Disposition: A | Discharge status: Home / Self Care | Payer: Medicare HMO | Source: Ambulatory Visit | Attending: Urology | Admitting: Urology

## 2021-07-27 ENCOUNTER — Other Ambulatory Visit: Payer: Self-pay

## 2021-07-27 DIAGNOSIS — K5730 Diverticulosis of large intestine without perforation or abscess without bleeding: Secondary | ICD-10-CM | POA: Insufficient documentation

## 2021-07-27 DIAGNOSIS — N400 Enlarged prostate without lower urinary tract symptoms: Secondary | ICD-10-CM | POA: Diagnosis not present

## 2021-07-27 DIAGNOSIS — R361 Hematospermia: Secondary | ICD-10-CM | POA: Insufficient documentation

## 2021-07-27 LAB — POCT I-STAT CREATININE: Creatinine, Ser: 1 mg/dL (ref 0.61–1.24)

## 2021-07-27 MED ORDER — IOHEXOL 300 MG/ML  SOLN
100.0000 mL | Freq: Once | INTRAMUSCULAR | Status: AC | PRN
  Administered 2021-07-27: 100 mL via INTRAVENOUS

## 2021-08-07 ENCOUNTER — Other Ambulatory Visit: Payer: Self-pay

## 2021-08-07 ENCOUNTER — Encounter: Payer: Self-pay | Admitting: Urology

## 2021-08-07 ENCOUNTER — Ambulatory Visit: Payer: Medicare HMO | Admitting: Urology

## 2021-08-07 VITALS — BP 146/73 | HR 77 | Ht 72.0 in | Wt 198.0 lb

## 2021-08-07 DIAGNOSIS — R361 Hematospermia: Secondary | ICD-10-CM | POA: Diagnosis not present

## 2021-08-07 LAB — URINALYSIS, COMPLETE
Bilirubin, UA: NEGATIVE
Glucose, UA: NEGATIVE
Ketones, UA: NEGATIVE
Leukocytes,UA: NEGATIVE
Nitrite, UA: NEGATIVE
Protein,UA: NEGATIVE
RBC, UA: NEGATIVE
Specific Gravity, UA: 1.025 (ref 1.005–1.030)
Urobilinogen, Ur: 0.2 mg/dL (ref 0.2–1.0)
pH, UA: 6.5 (ref 5.0–7.5)

## 2021-08-07 LAB — MICROSCOPIC EXAMINATION
Bacteria, UA: NONE SEEN
Epithelial Cells (non renal): NONE SEEN /hpf (ref 0–10)
RBC, Urine: NONE SEEN /hpf (ref 0–2)
WBC, UA: NONE SEEN /hpf (ref 0–5)

## 2021-08-07 NOTE — Progress Notes (Signed)
° °  08/07/21  CC:  Chief Complaint  Patient presents with   Cysto    HPI: 81 y.o. male with recurrent hematospermia who desired further evaluation.  Pelvic CT was unremarkable.  Prostate volume calculated at 75 cc.  He has not ejaculated since last visit  Blood pressure (!) 146/73, pulse 77, height 6' (1.829 m), weight 198 lb (89.8 kg). NED. A&Ox3.   No respiratory distress   Abd soft, NT, ND Normal phallus with bilateral descended testicles  Cystoscopy Procedure Note  Patient identification was confirmed, informed consent was obtained, and patient was prepped using Betadine solution.  Lidocaine jelly was administered per urethral meatus.     Pre-Procedure: - Inspection reveals a normal caliber urethral meatus.  Procedure: The flexible cystoscope was introduced without difficulty - No urethral strictures/lesions are present. -  Moderate lateral lobe enlargement  prostate with hypervascularity -Mild elevation bladder neck - Bilateral ureteral orifices identified - Bladder mucosa  reveals no ulcers, tumors, or lesions - No bladder stones -Mild trabeculation  Retroflexion shows no intravesical median lobe, lesions   Post-Procedure: - Patient tolerated the procedure well  Assessment/ Plan:  1.  Hematospermia Benign DRE with low/stable PSA Negative pelvic CT Negative cystoscopy Reassured the hematospermia was most likely secondary to BPH.  No further evaluation needed    Riki Altes, MD

## 2022-04-14 IMAGING — CT CT ANGIO CHEST-ABD-PELV FOR DISSECTION W/ AND WO/W CM
2 of 7 series · 14 of 46 positions shown, 16 images · IV contrast (APPLIED)
Comparison: Noncontrast chest CT 12/18/2014, 09/23/2014. Chest
radiograph 12/21/2020.

CLINICAL DATA: 80-year-old male with intermittent chest pain since
yesterday. Dizziness.

EXAM:
CT ANGIOGRAPHY CHEST, ABDOMEN AND PELVIS
TECHNIQUE: Non-contrast CT of the chest was initially obtained.

[Series 5: axial arterial · axial · arterial · 0.98mm/px · z∈[-1064,-456]mm · 11 of 237 slices shown, 13 images]
[im 17/237  soft-tissue]
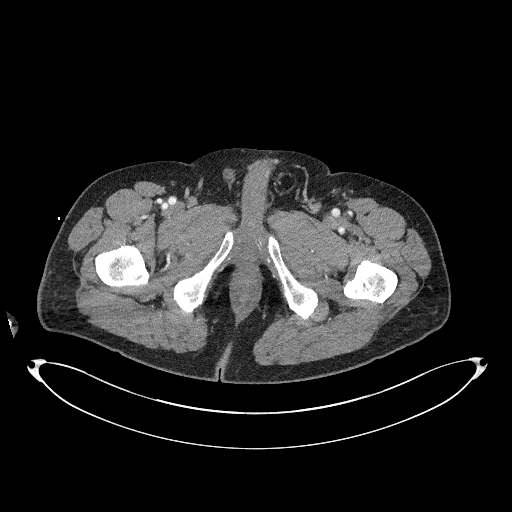
[im 17/237  bone]
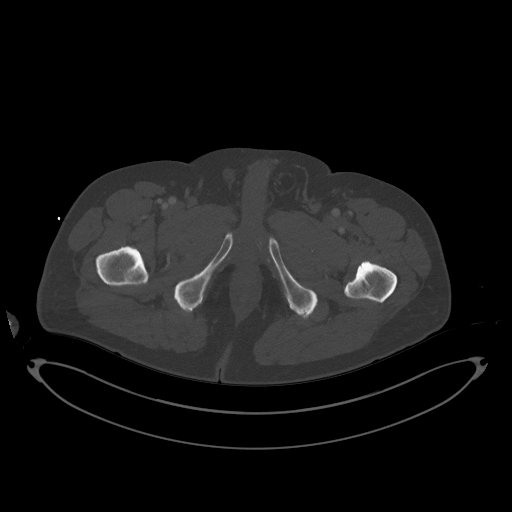
[im 34/237  soft-tissue]
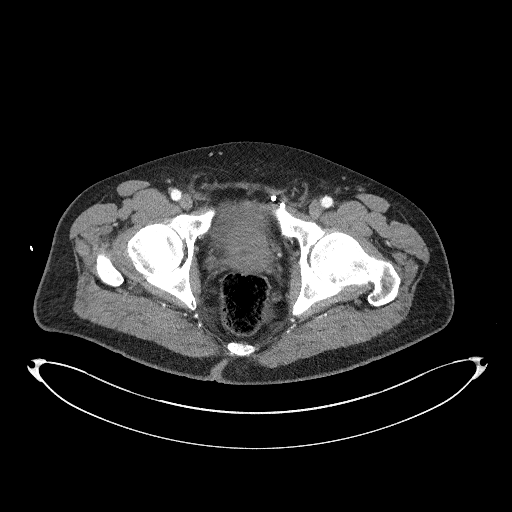
[im 51/237  soft-tissue]
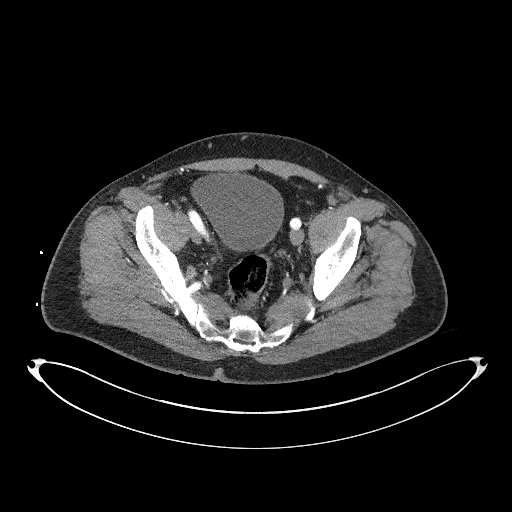
[im 85/237  soft-tissue]
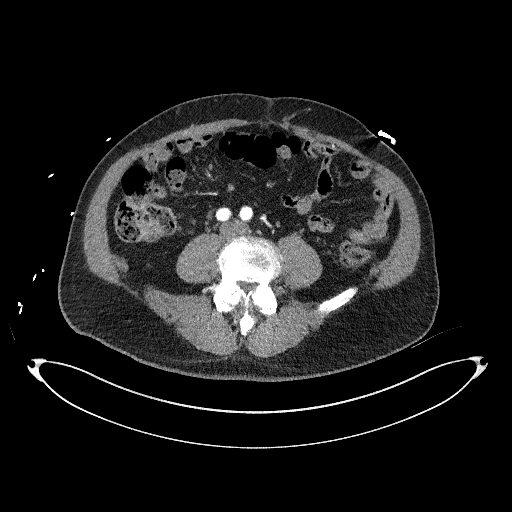
[im 102/237  soft-tissue]
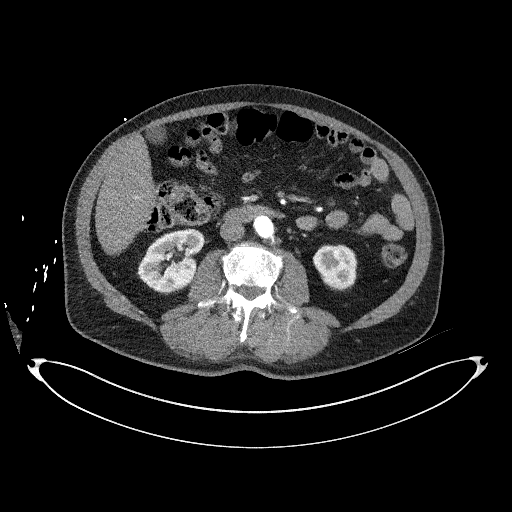
[im 119/237  soft-tissue]
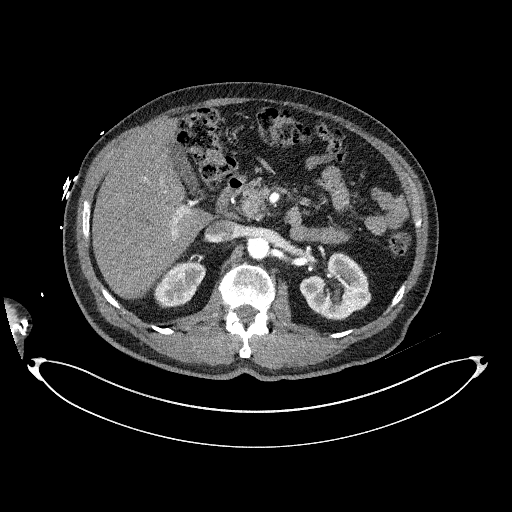
[im 135/237  soft-tissue]
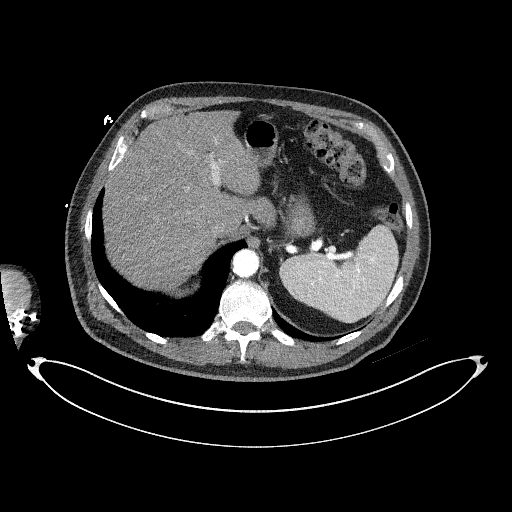
[im 152/237  soft-tissue]
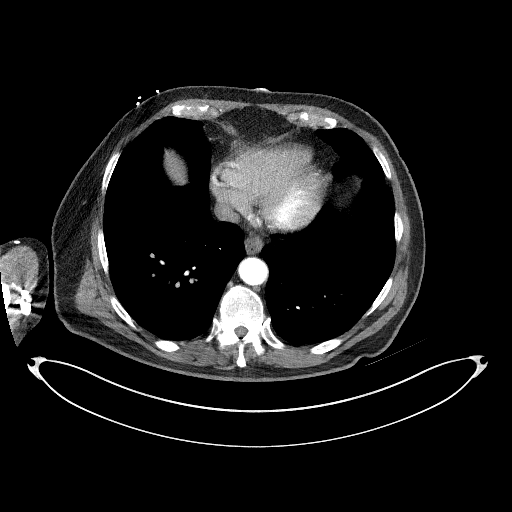
[im 186/237  soft-tissue]
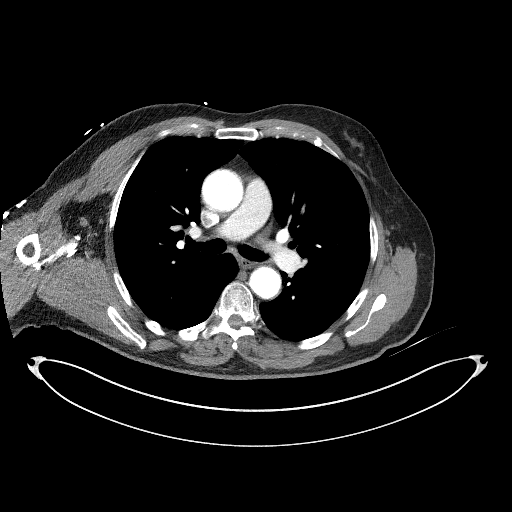
[im 186/237  bone]
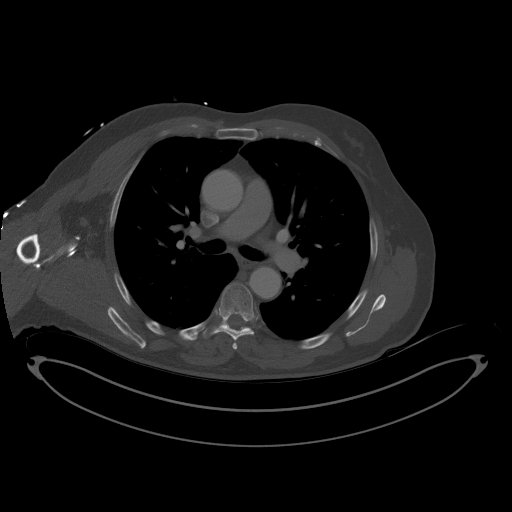
[im 203/237  soft-tissue]
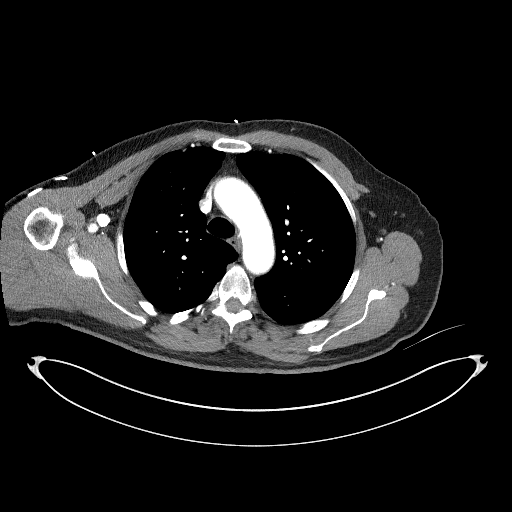
[im 220/237  soft-tissue]
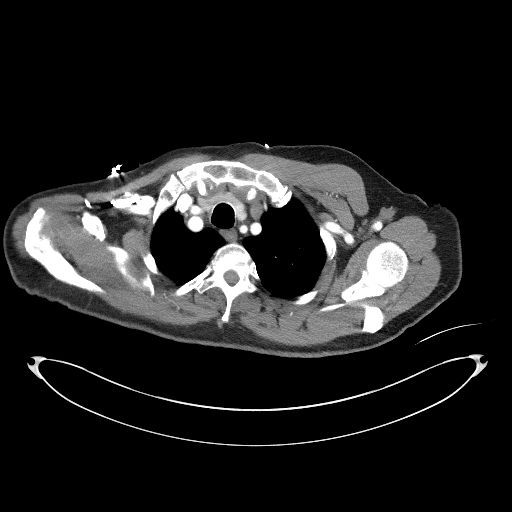

[Series 7: coronals · coronal · 0.92mm/px · 3 of 151 slices shown]
[im 38/151  soft-tissue]
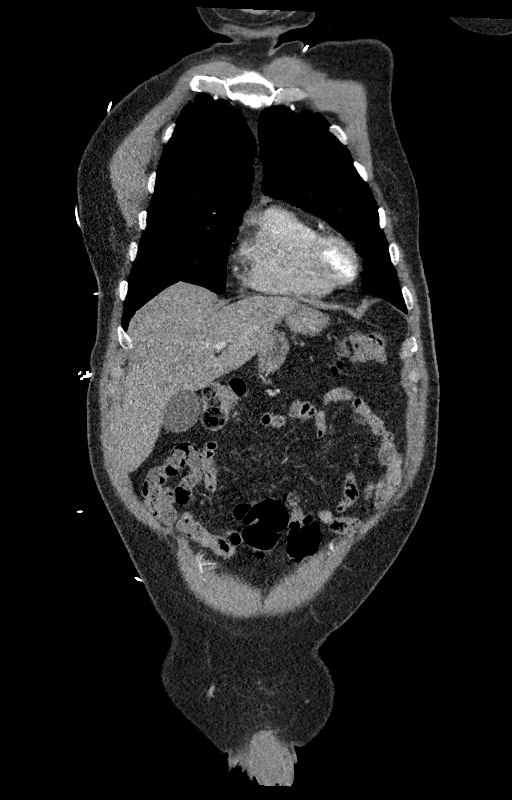
[im 76/151  soft-tissue]
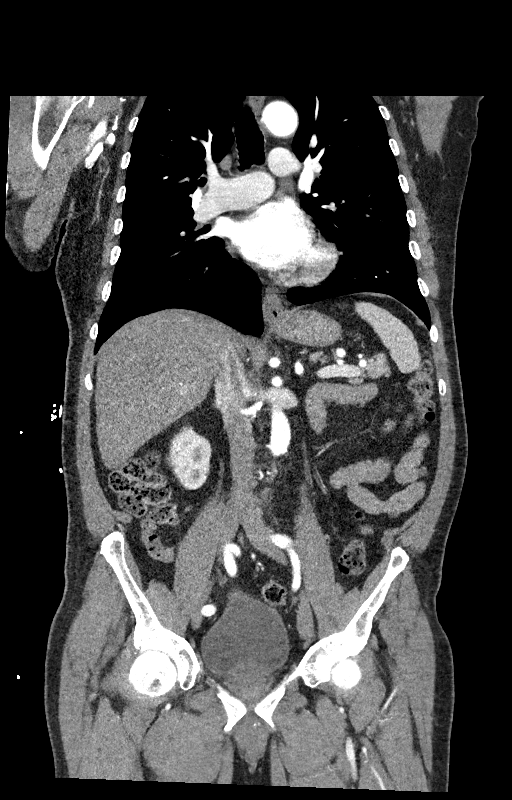
[im 113/151  soft-tissue]
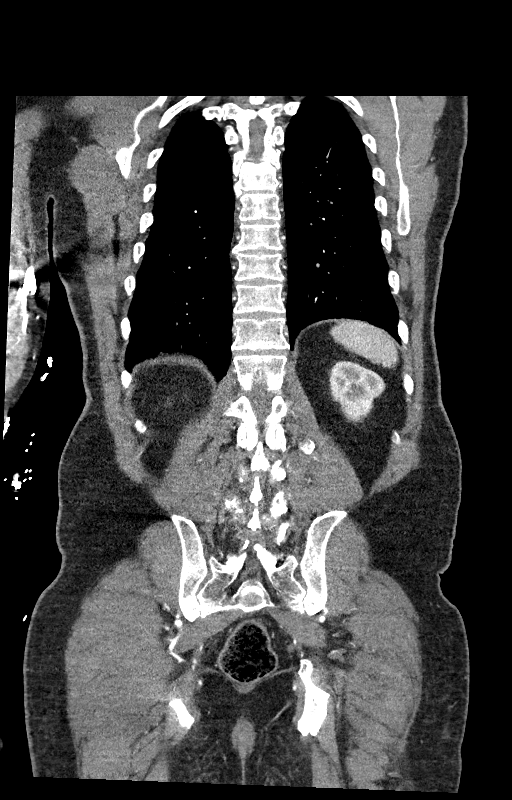

[14 of 46 positions shown; findings below may reference images not displayed]

Multidetector CT imaging through the chest, abdomen and pelvis was
performed using the standard protocol during bolus administration of
intravenous contrast. Multiplanar reconstructed images and MIPs were
obtained and reviewed to evaluate the vascular anatomy.

CONTRAST:  100mL OMNIPAQUE IOHEXOL 350 MG/ML SOLN
FINDINGS: CTA CHEST FINDINGS

Cardiovascular: Faint calcified coronary artery atherosclerosis on
series 4, image 37. No cardiomegaly or pericardial effusion. Mild
Calcified aortic atherosclerosis. No thoracic aortic dissection or
aneurysm. Little pulmonary artery enhancement on this exam.

Mediastinum/Nodes: Negative.  No lymphadenopathy.

Lungs/Pleura: Chronic right lower lobe lung nodule on series 6 image
133 is stable since 4224 and benign. Larger lung volumes compared to
prior CTs. Major airways are patent. Minor dependent atelectasis.
Small calcified granuloma in the right costophrenic angle with
regressed lung opacity there since 4224, benign. Occasional mild
subpleural scarring in both lungs. No pleural effusion.

Musculoskeletal: Negative for age. No acute osseous abnormality
identified.

Review of the MIP images confirms the above findings.

CTA ABDOMEN AND PELVIS FINDINGS

VASCULAR

Mild for age Aortoiliac calcified atherosclerosis. Negative for
abdominal aortic aneurysm or dissection. Major aortic branches are
patent. Mildly tortuous iliac arteries. Proximal femoral arteries
appear patent and negative.

Review of the MIP images confirms the above findings.

NON-VASCULAR

Hepatobiliary: Hepatic steatosis. Occasional punctate calcified
liver granulomas. A subtle small low-density area in the liver dome
on series 4, image 58 was present in 4224 and most likely a benign
cyst. Negative gallbladder.

Pancreas: Negative.

Spleen: Negative.

Adrenals/Urinary Tract: Normal adrenal glands. Symmetric renal
enhancement. Negative kidneys. Decompressed ureters. Mildly
distended but otherwise unremarkable urinary bladder.

Stomach/Bowel: Redundant large bowel with retained stool. Diminutive
or absent appendix. No large bowel inflammation. Negative terminal
ileum. No dilated small bowel. Decompressed stomach and duodenum. No
free air, free fluid or mesenteric inflammation.

Lymphatic: No lymphadenopathy.

Reproductive: Sequelae of left inguinal hernia repair with no
adverse features. Prostatomegaly. Otherwise negative.

Other: No pelvic free fluid.

Musculoskeletal: Mild lower lumbar spondylolisthesis with facet
arthropathy. Vacuum disc at the lumbosacral junction. No acute
osseous abnormality identified.

Review of the MIP images confirms the above findings.
IMPRESSION: 1. Negative for aortic dissection or aneurysm. Mild for age Aortic
Atherosclerosis ([id number]). Faint calcified coronary artery
atherosclerosis.

2. No acute or inflammatory process identified in the chest,
abdomen, or pelvis.
Benign right lower lobe lung nodule stable since 4224.
Hepatic steatosis.
Prior left inguinal hernia repair.

## 2022-11-17 IMAGING — CT CT PELVIS W/ CM
2 of 3 series · 17 of 46 positions shown, 19 images · IV contrast (agent unspecified)
Comparison: December 22, 2020.

CLINICAL DATA: Persistent hematospermia.

EXAM:
CT PELVIS WITH CONTRAST
TECHNIQUE: Multidetector CT imaging of the pelvis was performed using the
standard protocol following the bolus administration of intravenous
contrast.

[Series 2: pelvis 5.00 · axial · 0.74mm/px · z∈[-1608,-1333]mm · 14 of 65 slices shown, 16 images]
[im 5/65  soft-tissue]
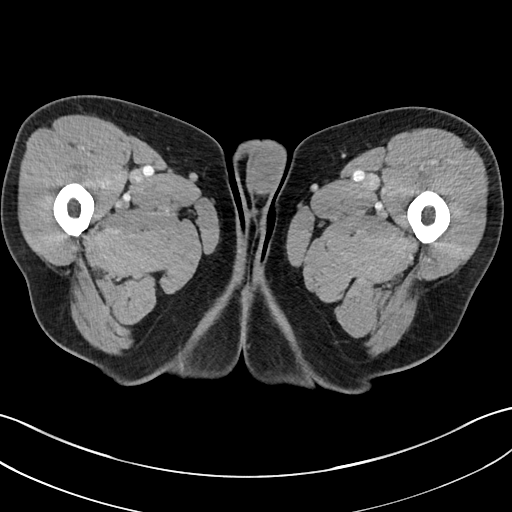
[im 5/65  bone]
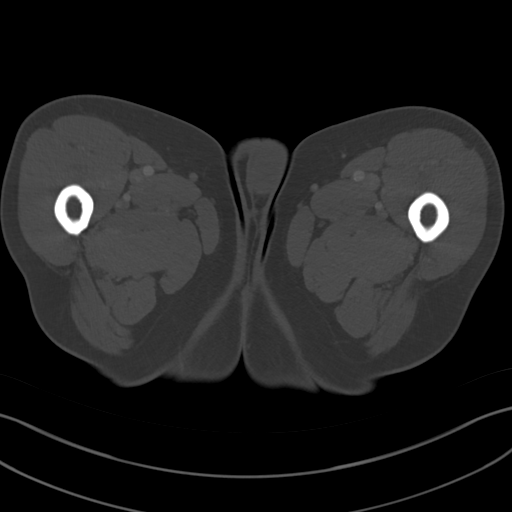
[im 9/65  soft-tissue]
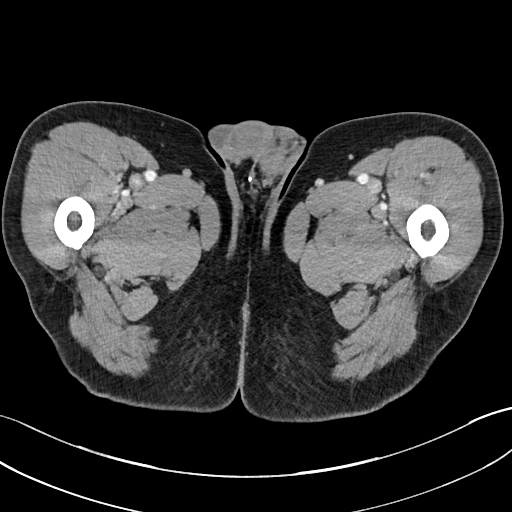
[im 13/65  soft-tissue]
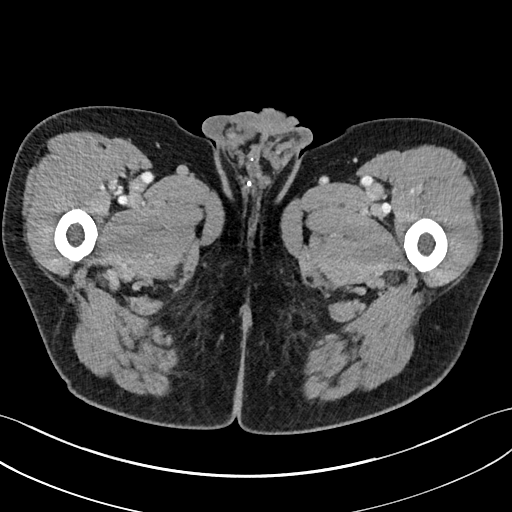
[im 17/65  soft-tissue]
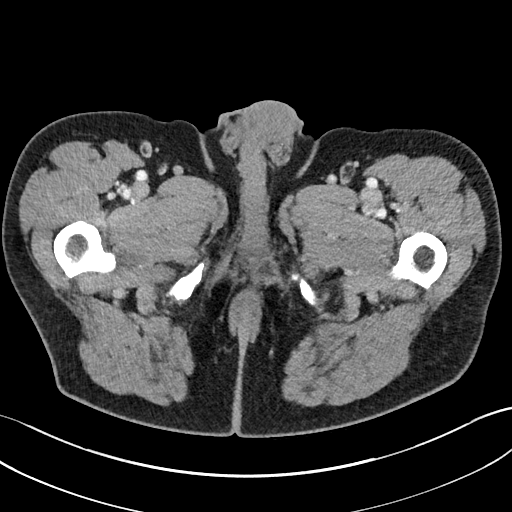
[im 21/65  soft-tissue]
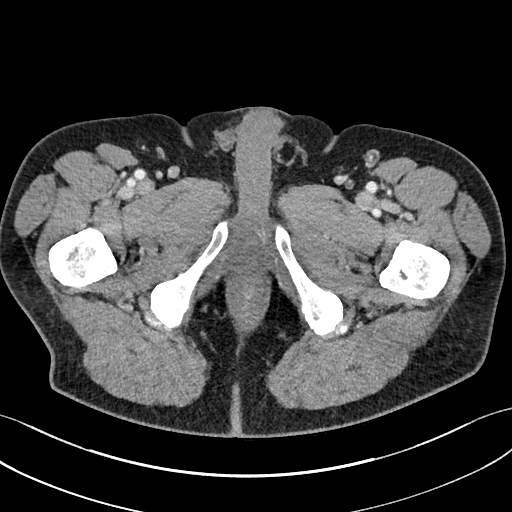
[im 25/65  soft-tissue]
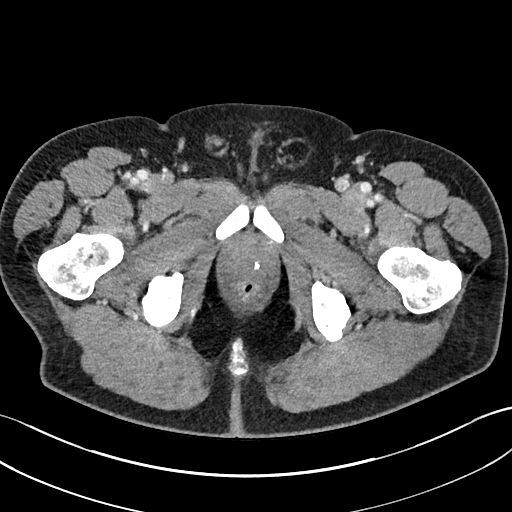
[im 29/65  soft-tissue]
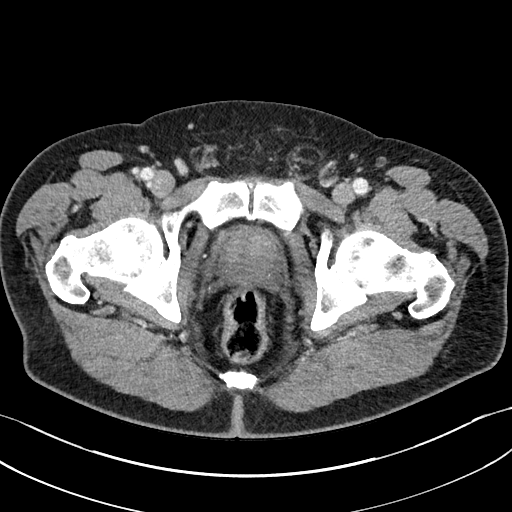
[im 36/65  soft-tissue]
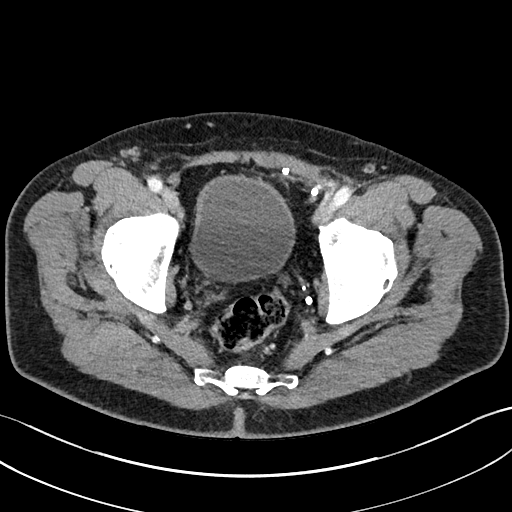
[im 40/65  soft-tissue]
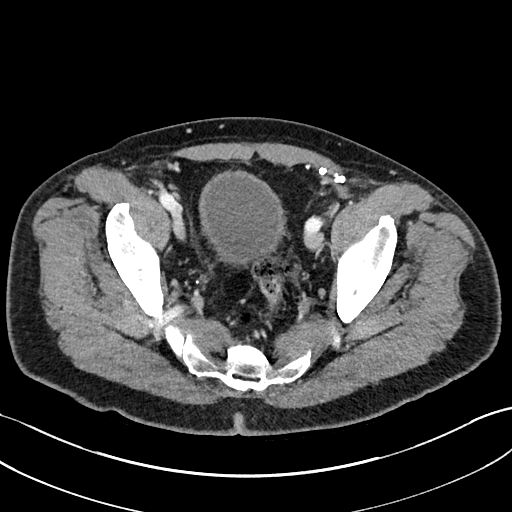
[im 40/65  bone]
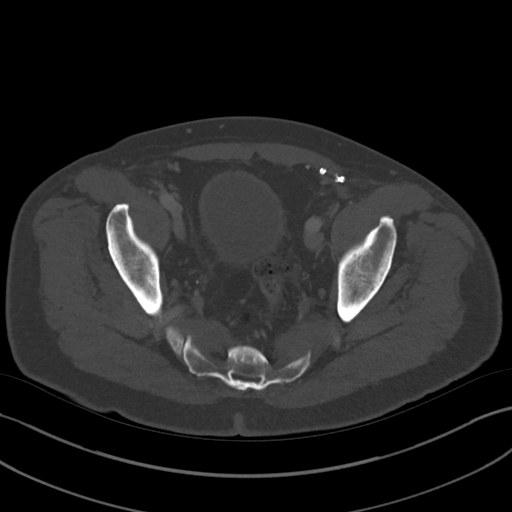
[im 44/65  soft-tissue]
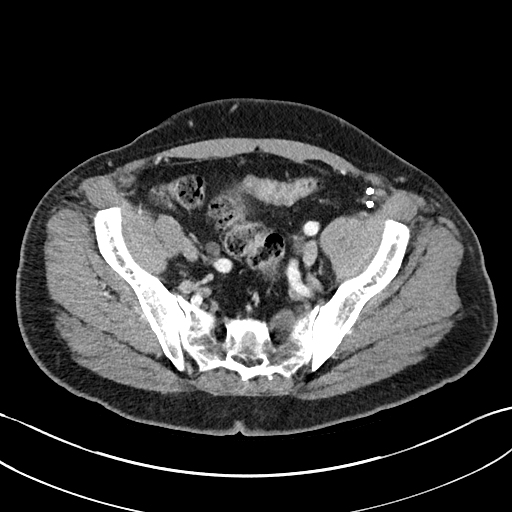
[im 48/65  soft-tissue]
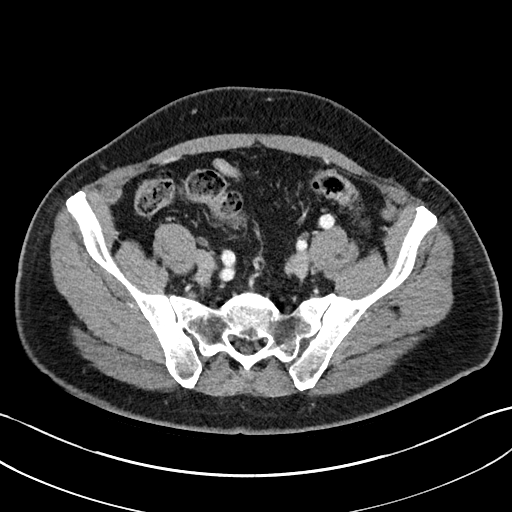
[im 52/65  soft-tissue]
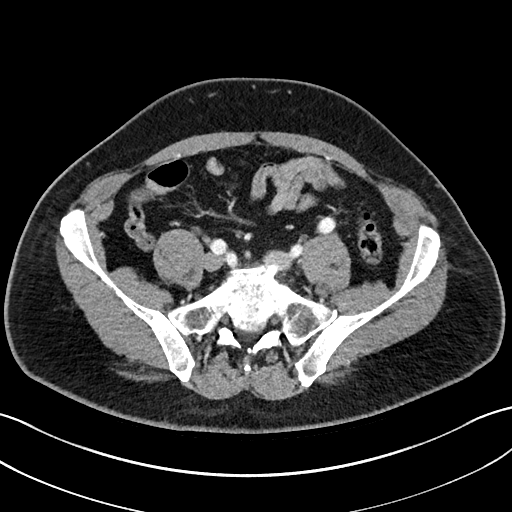
[im 56/65  soft-tissue]
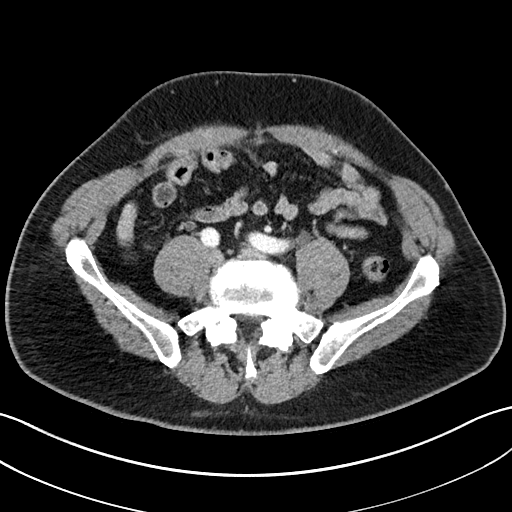
[im 60/65  soft-tissue]
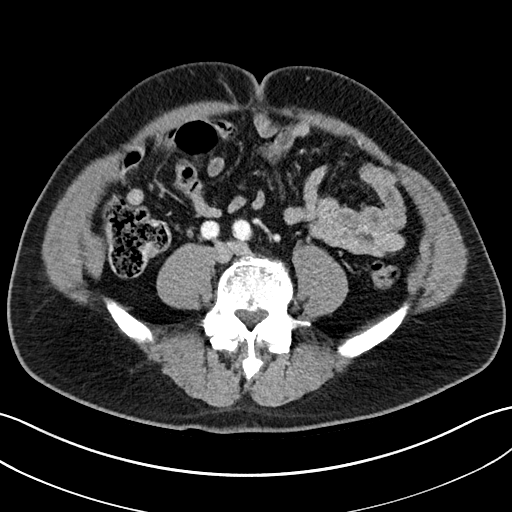

[Series 3: pelvis 2.00 cor · coronal · 0.64mm/px · 3 of 146 slices shown]
[im 49/146  soft-tissue]
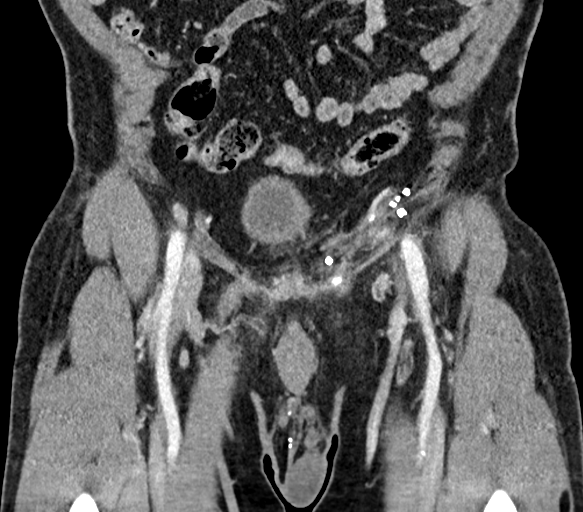
[im 65/146  soft-tissue]
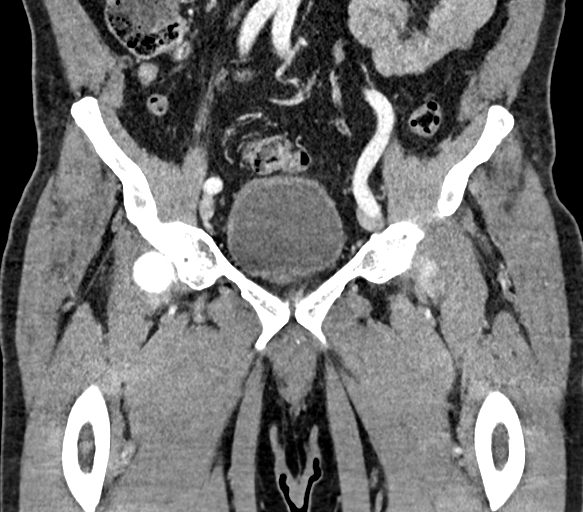
[im 81/146  soft-tissue]
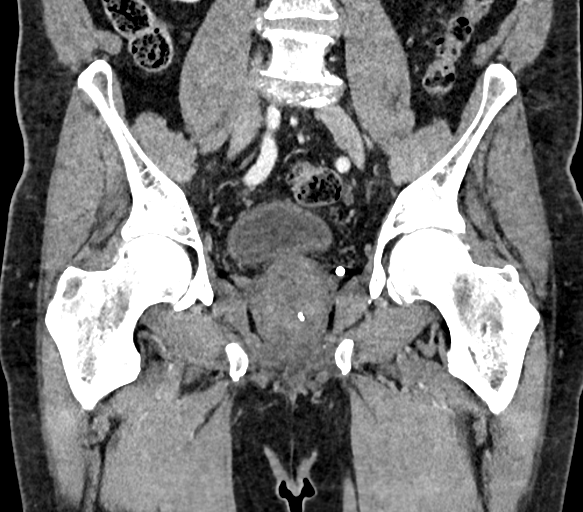

[17 of 46 positions shown; findings below may reference images not displayed]

RADIATION DOSE REDUCTION: This exam was performed according to the
departmental dose-optimization program which includes automated
exposure control, adjustment of the mA and/or kV according to
patient size and/or use of iterative reconstruction technique.

CONTRAST:  100mL OMNIPAQUE IOHEXOL 300 MG/ML  SOLN
FINDINGS: Urinary Tract: Urinary bladder is unremarkable for degree of
distension.

Bowel: Colonic diverticulosis without findings of acute
diverticulitis. No pathologic dilation or evidence acute
inflammation involving loops of small or large bowel in the pelvis.

Vascular/Lymphatic: No pathologically enlarged lymph nodes. No
significant vascular abnormality seen.

Reproductive: Mild prostate enlargement. Trace bilateral hydroceles.

Other: Prior left inguinal hernia repair. Asymmetric fat in the left
inguinal canal.

Musculoskeletal: Lumbosacral spondylosis. Mild degenerative changes
bilateral hips. No acute osseous abnormality.
IMPRESSION: 1. Mild prostate enlargement.
2. Trace bilateral hydroceles.
3. Colonic diverticulosis without findings of acute diverticulitis.

## 2023-03-07 ENCOUNTER — Encounter: Payer: Self-pay | Admitting: Emergency Medicine

## 2023-03-07 ENCOUNTER — Emergency Department: Payer: Medicare HMO

## 2023-03-07 ENCOUNTER — Emergency Department
Admission: EM | Admit: 2023-03-07 | Discharge: 2023-03-07 | Disposition: A | Discharge status: Home / Self Care | Payer: Medicare HMO | Source: Home / Self Care | Attending: Emergency Medicine | Admitting: Emergency Medicine

## 2023-03-07 ENCOUNTER — Other Ambulatory Visit: Payer: Self-pay

## 2023-03-07 DIAGNOSIS — G43709 Chronic migraine without aura, not intractable, without status migrainosus: Secondary | ICD-10-CM

## 2023-03-07 DIAGNOSIS — E871 Hypo-osmolality and hyponatremia: Secondary | ICD-10-CM | POA: Diagnosis not present

## 2023-03-07 DIAGNOSIS — I10 Essential (primary) hypertension: Secondary | ICD-10-CM | POA: Diagnosis not present

## 2023-03-07 DIAGNOSIS — G43E09 Chronic migraine with aura, not intractable, without status migrainosus: Secondary | ICD-10-CM | POA: Insufficient documentation

## 2023-03-07 DIAGNOSIS — R200 Anesthesia of skin: Secondary | ICD-10-CM | POA: Diagnosis present

## 2023-03-07 LAB — COMPREHENSIVE METABOLIC PANEL
ALT: 22 U/L (ref 0–44)
AST: 28 U/L (ref 15–41)
Albumin: 4.3 g/dL (ref 3.5–5.0)
Alkaline Phosphatase: 47 U/L (ref 38–126)
Anion gap: 11 (ref 5–15)
BUN: 16 mg/dL (ref 8–23)
CO2: 22 mmol/L (ref 22–32)
Calcium: 9.5 mg/dL (ref 8.9–10.3)
Chloride: 100 mmol/L (ref 98–111)
Creatinine, Ser: 0.74 mg/dL (ref 0.61–1.24)
GFR, Estimated: >60 mL/min (ref >60)
Glucose, Bld: 92 mg/dL (ref 70–99)
Potassium: 3.7 mmol/L (ref 3.5–5.1)
Sodium: 133 mmol/L — ABNORMAL LOW (ref 135–145)
Total Bilirubin: 0.8 mg/dL (ref 0.3–1.2)
Total Protein: 6.9 g/dL (ref 6.5–8.1)

## 2023-03-07 LAB — CBC WITH DIFFERENTIAL/PLATELET
Abs Immature Granulocytes: 0.02 10*3/uL (ref 0.00–0.07)
Basophils Absolute: 0 10*3/uL (ref 0.0–0.1)
Basophils Relative: 1 %
Eosinophils Absolute: 0.1 10*3/uL (ref 0.0–0.5)
Eosinophils Relative: 1 %
HCT: 46.6 % (ref 39.0–52.0)
Hemoglobin: 17 g/dL (ref 13.0–17.0)
Immature Granulocytes: 0 %
Lymphocytes Relative: 18 %
Lymphs Abs: 1.2 10*3/uL (ref 0.7–4.0)
MCH: 33.7 pg (ref 26.0–34.0)
MCHC: 36.5 g/dL — ABNORMAL HIGH (ref 30.0–36.0)
MCV: 92.3 fL (ref 80.0–100.0)
Monocytes Absolute: 0.5 10*3/uL (ref 0.1–1.0)
Monocytes Relative: 7 %
Neutro Abs: 4.7 10*3/uL (ref 1.7–7.7)
Neutrophils Relative %: 73 %
Platelets: 194 10*3/uL (ref 150–400)
RBC: 5.05 MIL/uL (ref 4.22–5.81)
RDW: 12.4 % (ref 11.5–15.5)
WBC: 6.4 10*3/uL (ref 4.0–10.5)
nRBC: 0 % (ref 0.0–0.2)

## 2023-03-07 LAB — CBG MONITORING, ED: Glucose-Capillary: 90 mg/dL (ref 70–99)

## 2023-03-07 LAB — MAGNESIUM: Magnesium: 2.1 mg/dL (ref 1.7–2.4)

## 2023-03-07 MED ORDER — LACTATED RINGERS IV BOLUS
1000.0000 mL | Freq: Once | INTRAVENOUS | Status: AC
  Administered 2023-03-07: 1000 mL via INTRAVENOUS

## 2023-03-07 NOTE — Discharge Instructions (Signed)
Please follow-up with your primary care provider tomorrow for ongoing assessment.  Please return for any worsening symptoms.

## 2023-03-07 NOTE — ED Provider Notes (Signed)
[facility] Provider Note    Event Date/Time   First MD Initiated Contact with Patient 03/07/23 1203     (approximate)   History   Numbness   HPI Earl Holmes is a 82 y.o. male HTN, HLD presenting today for lightheadedness.  Patient states having a head injury 6 years ago which is left him having intermittent lightheaded symptoms.  He notes symptoms occur weekly and almost every day where he gets facial numbness preceding headache.  He then feels a little lightheaded and has some nausea associated with it.  He does not take any medication to resolve the symptoms and they resolve on their own.  He has not seen his primary care provider about this in the past 6 years.  Otherwise denies one-sided numbness, weakness, facial droop, speech changes, vision changes, chest pain, shortness of breath, fever, chills, abdominal pain.     Physical Exam   Triage Vital Signs: ED Triage Vitals  Encounter Vitals Group     BP 03/07/23 1152 (!) 165/96     Systolic BP Percentile --      Diastolic BP Percentile --      Pulse Rate 03/07/23 1152 71     Resp 03/07/23 1152 18     Temp 03/07/23 1152 98.1 F (36.7 C)     Temp Source 03/07/23 1152 Oral     SpO2 03/07/23 1152 93 %     Weight 03/07/23 1153 198 lb (89.8 kg)     Height 03/07/23 1153 6' (1.829 m)     Head Circumference --      Peak Flow --      Pain Score 03/07/23 1153 0     Pain Loc --      Pain Education --      Exclude from Growth Chart --     Most recent vital signs: Vitals:   03/07/23 1152 03/07/23 1240  BP: (!) 165/96 (!) 156/88  Pulse: 71 63  Resp: 18 18  Temp: 98.1 F (36.7 C)   SpO2: 93% 94%   Physical Exam: I have reviewed the vital signs and nursing notes. General: Awake, alert, no acute distress.  Nontoxic appearing. Head:  Atraumatic, normocephalic.   ENT:  EOM intact, PERRL. Oral mucosa is pink and moist with no lesions. Neck: Neck is supple with full range of motion, No meningeal  signs. Cardiovascular:  RRR, No murmurs. Peripheral pulses palpable and equal bilaterally. Respiratory:  Symmetrical chest wall expansion.  No rhonchi, rales, or wheezes.  Good air movement throughout.  No use of accessory muscles.   Musculoskeletal:  No cyanosis or edema. Moving extremities with full ROM Abdomen:  Soft, nontender, nondistended. Neuro:  GCS 15, moving all four extremities, interacting appropriately. Alert and oriented with cogent speech; 5/5 motor strength all 4 extremities with intact peripheral nerve distributions; gross sensory intact to challenge in all 4 extremities and peripheral nerve distributions; no clonus; cerebellar examination normal including Romberg, finger-to-nose, heel-to-shin, and without trunkal ataxia, dysdiadochokinesis or dysmetria; cranial nerves 2-12 intact to gross challenge bilaterally. Psych:  Calm, appropriate.   Skin:  Warm, dry, no rash.     ED Results / Procedures / Treatments   Labs (all labs ordered are listed, but only abnormal results are displayed) Labs Reviewed  CBC WITH DIFFERENTIAL/PLATELET - Abnormal; Notable for the following components:      Result Value   MCHC 36.5 (*)    All other components within normal limits  COMPREHENSIVE METABOLIC PANEL -  Abnormal; Notable for the following components:   Sodium 133 (*)    All other components within normal limits  MAGNESIUM  CBG MONITORING, ED     EKG    RADIOLOGY Independently interpreted CT head with no acute pathology   PROCEDURES:  Critical Care performed: No  Procedures   MEDICATIONS ORDERED IN ED: Medications  lactated ringers bolus 1,000 mL (1,000 mLs Intravenous New Bag/Given 03/07/23 1242)     IMPRESSION / MDM / ASSESSMENT AND PLAN / ED COURSE  I reviewed the triage vital signs and the nursing notes.                              Differential diagnosis includes, but is not limited to, complex migraine, dehydration, electrolyte abnormality, cardiac  arrhythmia, less likely CVA.  Patient's presentation is most consistent with acute complicated illness / injury requiring diagnostic workup.  Patient is an 82 year old male presenting today for intermittent periods of facial numbness followed by headaches and some lightheadedness.  Symptoms been going on for 6 years and originally were not frequent but now seem to be happening every couple days to a week.  Asymptomatic here at this time with totally normal neurological examination.  CT head shows no acute pathology.  Laboratory workup largely reassuring as well aside from mild hyponatremia.  Given his symptoms of bilateral facial numbness and tingling that then develops into a headache, do have some concern that this may be complex migraine symptoms with a preceding aura.  He has no other numbness or weakness symptoms when this happens.  Otherwise resolves once the headache is gone.  Patient stable for discharge and will follow-up with his PCP tomorrow.  They were given strict return precautions.  The patient is on the cardiac monitor to evaluate for evidence of arrhythmia and/or significant heart rate changes. Clinical Course as of 03/07/23 1438  Mon Mar 07, 2023  1245 CBC with Differential(!) unremarkable [DW]  1305 Comprehensive metabolic panel(!) Mild hyponatremia otherwise unremarkable [DW]  1305 Magnesium: 2.1 [DW]  1415 CT Head Wo Contrast CT head shows no acute pathology per my interpretation [DW]  1428 Reassessed patient.  Asymptomatic at this time. [DW]    Clinical Course User Index [DW] Janith Lima, MD     FINAL CLINICAL IMPRESSION(S) / ED DIAGNOSES   Final diagnoses:  Facial numbness  Chronic migraine without aura without status migrainosus, not intractable     Rx / DC Orders   ED Discharge Orders     None        Note:  This document was prepared using Dragon voice recognition software and may include unintentional dictation errors.   Janith Lima,  MD 03/07/23 [phone]

## 2023-03-07 NOTE — ED Triage Notes (Signed)
Patient arrives ambulatory by POV c/o generalized facial numbness. Patient states yesterday feeling light headed like he was going to pass out. States he has had these episodes intermittently over past years since head injury. States his BP has been high over the past month and has been keeping track of it for his PCP. Also reports feeling nauseated since about 10 am today.

## 2023-03-12 ENCOUNTER — Other Ambulatory Visit: Payer: Self-pay

## 2023-03-12 ENCOUNTER — Encounter: Payer: Self-pay | Admitting: Emergency Medicine

## 2023-03-12 ENCOUNTER — Emergency Department
Admission: EM | Admit: 2023-03-12 | Discharge: 2023-03-12 | Disposition: A | Discharge status: Home / Self Care | Payer: Medicare HMO | Attending: Student in an Organized Health Care Education/Training Program | Admitting: Student in an Organized Health Care Education/Training Program

## 2023-03-12 ENCOUNTER — Emergency Department: Payer: Medicare HMO

## 2023-03-12 DIAGNOSIS — Z8546 Personal history of malignant neoplasm of prostate: Secondary | ICD-10-CM | POA: Insufficient documentation

## 2023-03-12 DIAGNOSIS — R519 Headache, unspecified: Secondary | ICD-10-CM | POA: Insufficient documentation

## 2023-03-12 LAB — COMPREHENSIVE METABOLIC PANEL
ALT: 21 U/L (ref 0–44)
AST: 23 U/L (ref 15–41)
Albumin: 4.7 g/dL (ref 3.5–5.0)
Alkaline Phosphatase: 49 U/L (ref 38–126)
Anion gap: 14 (ref 5–15)
BUN: 18 mg/dL (ref 8–23)
CO2: 23 mmol/L (ref 22–32)
Calcium: 9.4 mg/dL (ref 8.9–10.3)
Chloride: 95 mmol/L — ABNORMAL LOW (ref 98–111)
Creatinine, Ser: 0.86 mg/dL (ref 0.61–1.24)
GFR, Estimated: >60 mL/min (ref >60)
Glucose, Bld: 95 mg/dL (ref 70–99)
Potassium: 3.2 mmol/L — ABNORMAL LOW (ref 3.5–5.1)
Sodium: 132 mmol/L — ABNORMAL LOW (ref 135–145)
Total Bilirubin: 1.3 mg/dL — ABNORMAL HIGH (ref 0.3–1.2)
Total Protein: 7.3 g/dL (ref 6.5–8.1)

## 2023-03-12 LAB — CBC WITH DIFFERENTIAL/PLATELET
Abs Immature Granulocytes: 0.02 10*3/uL (ref 0.00–0.07)
Basophils Absolute: 0 10*3/uL (ref 0.0–0.1)
Basophils Relative: 1 %
Eosinophils Absolute: 0.1 10*3/uL (ref 0.0–0.5)
Eosinophils Relative: 1 %
HCT: 47.7 % (ref 39.0–52.0)
Hemoglobin: 17.3 g/dL — ABNORMAL HIGH (ref 13.0–17.0)
Immature Granulocytes: 0 %
Lymphocytes Relative: 20 %
Lymphs Abs: 1.3 10*3/uL (ref 0.7–4.0)
MCH: 33.1 pg (ref 26.0–34.0)
MCHC: 36.3 g/dL — ABNORMAL HIGH (ref 30.0–36.0)
MCV: 91.4 fL (ref 80.0–100.0)
Monocytes Absolute: 0.5 10*3/uL (ref 0.1–1.0)
Monocytes Relative: 8 %
Neutro Abs: 4.5 10*3/uL (ref 1.7–7.7)
Neutrophils Relative %: 70 %
Platelets: 200 10*3/uL (ref 150–400)
RBC: 5.22 MIL/uL (ref 4.22–5.81)
RDW: 12.3 % (ref 11.5–15.5)
WBC: 6.4 10*3/uL (ref 4.0–10.5)
nRBC: 0 % (ref 0.0–0.2)

## 2023-03-12 MED ORDER — METOCLOPRAMIDE HCL 5 MG/ML IJ SOLN
5.0000 mg | Freq: Once | INTRAMUSCULAR | Status: AC
  Administered 2023-03-12: 5 mg via INTRAVENOUS
  Filled 2023-03-12: qty 2

## 2023-03-12 MED ORDER — METOCLOPRAMIDE HCL 5 MG PO TABS: 5.0000 mg | ORAL_TABLET | Freq: Three times a day (TID) | ORAL | 0 refills | Status: AC | PRN

## 2023-03-12 MED ORDER — DIPHENHYDRAMINE HCL 50 MG/ML IJ SOLN
12.5000 mg | Freq: Once | INTRAMUSCULAR | Status: AC
  Administered 2023-03-12: 12.5 mg via INTRAVENOUS
  Filled 2023-03-12: qty 1

## 2023-03-12 MED ORDER — ACETAMINOPHEN 500 MG PO TABS
1000.0000 mg | ORAL_TABLET | Freq: Once | ORAL | Status: AC
  Administered 2023-03-12: 1000 mg via ORAL
  Filled 2023-03-12: qty 2

## 2023-03-12 NOTE — ED Provider Notes (Signed)
82 year old male with history of BPH here with headache.  Patient sent in for MRI from his PCP.  No focal neurological deficits.  Feels better after analgesia.  Plan to follow-up MRI and discharge if unremarkable.  MRI negative.  Symptoms improved.  Vital signs stable.   Shaune Pollack, MD 03/12/23 [phone]

## 2023-03-12 NOTE — ED Triage Notes (Addendum)
First Nurse Note;  Pt via [facility] from home. Pt c/o headache intermittently weeks that has gotten worse today. CT scan already done and was seen on Tuesday for same. Pt states he is here for an MRI, does not see a neurologist. Pt is A&Ox4 and NAD. Pt has a hx of HTN, EMS report 176/84, HR 83, 96% on RA, 18 RR.

## 2023-03-12 NOTE — ED Provider Notes (Signed)
[facility] Provider Note    Event Date/Time   First MD Initiated Contact with Patient 03/12/23 1310     (approximate)   History   Headache   HPI  Earl Holmes is a 82 y.o. male with a history of BPH prostate cancer presents to the ER for evaluation of persistent and worsening headache.  States that awakes with it in the morning and becomes progressively worse.  Here for similar symptoms few days ago had CT imaging which was normal.  Follow-up with  pcp and told he had chronic migraines versus postconcussive syndrome after injury.  Feels like the headache and symptoms are getting worse and has come to the ER for MRI.  Denies any fevers no neck stiffness.  No interval fall.     Physical Exam   Triage Vital Signs: ED Triage Vitals  Encounter Vitals Group     BP 03/12/23 1258 (!) 152/82     Systolic BP Percentile --      Diastolic BP Percentile --      Pulse Rate 03/12/23 1258 86     Resp 03/12/23 1258 18     Temp 03/12/23 1258 97.7 F (36.5 C)     Temp Source 03/12/23 1258 Oral     SpO2 03/12/23 1258 95 %     Weight 03/12/23 1302 192 lb (87.1 kg)     Height 03/12/23 1302 6\' 1"  (1.854 m)     Head Circumference --      Peak Flow --      Pain Score 03/12/23 1302 7     Pain Loc --      Pain Education --      Exclude from Growth Chart --     Most recent vital signs: Vitals:   03/12/23 1344 03/12/23 1400  BP: (!) 154/81 127/71  Pulse:  (!) 59  Resp:    Temp:    SpO2:  92%     Constitutional: Alert  Eyes: Conjunctivae are normal.  Head: Atraumatic. Nose: No congestion/rhinnorhea. Mouth/Throat: Mucous membranes are moist.   Neck: Painless ROM.  Cardiovascular:   Good peripheral circulation. Respiratory: Normal respiratory effort.  No retractions.  Gastrointestinal: Soft and nontender.  Musculoskeletal:  no deformity Neurologic:  MAE spontaneously. No gross focal neurologic deficits are appreciated.  Skin:  Skin is warm, dry and  intact. No rash noted. Psychiatric: Mood and affect are normal. Speech and behavior are normal.    ED Results / Procedures / Treatments   Labs (all labs ordered are listed, but only abnormal results are displayed) Labs Reviewed  CBC WITH DIFFERENTIAL/PLATELET - Abnormal; Notable for the following components:      Result Value   Hemoglobin 17.3 (*)    MCHC 36.3 (*)    All other components within normal limits  COMPREHENSIVE METABOLIC PANEL - Abnormal; Notable for the following components:   Sodium 132 (*)    Potassium 3.2 (*)    Chloride 95 (*)    Total Bilirubin 1.3 (*)    All other components within normal limits     EKG     RADIOLOGY Please see ED Course for my review and interpretation.  I personally reviewed all radiographic images ordered to evaluate for the above acute complaints and reviewed radiology reports and findings.  These findings were personally discussed with the patient.  Please see medical record for radiology report.    PROCEDURES:  Critical Care performed: No  Procedures   MEDICATIONS ORDERED  IN ED: Medications  diphenhydrAMINE (BENADRYL) injection 12.5 mg (12.5 mg Intravenous Given 03/12/23 1341)  metoCLOPramide (REGLAN) injection 5 mg (5 mg Intravenous Given 03/12/23 1340)  acetaminophen (TYLENOL) tablet 1,000 mg (1,000 mg Oral Given 03/12/23 1338)     IMPRESSION / MDM / ASSESSMENT AND PLAN / ED COURSE  I reviewed the triage vital signs and the nursing notes.                              Differential diagnosis includes, but is not limited to, SAH, ICH, mass, migraine, tension, doubt meningitis, GCA  Patient presenting to the ER for evaluation of symptoms as described above.  Based on symptoms, risk factors and considered above differential, this presenting complaint could reflect a potentially life-threatening illness therefore the patient will be placed on continuous pulse oximetry and telemetry for monitoring.  Laboratory evaluation will be  sent to evaluate for the above complaints.  Will provide symptomatic management.  Given worsening pain symptoms will order MRI to further evaluate.    Clinical Course as of 03/12/23 1505  Sat Mar 12, 2023  1504 Patient signed out to oncoming physician pending follow-up MRI.  Anticipate discharge home if negative. [PR]    Clinical Course User Index [PR] Willy Eddy, MD     FINAL CLINICAL IMPRESSION(S) / ED DIAGNOSES   Final diagnoses:  Chronic nonintractable headache, unspecified headache type     Rx / DC Orders   ED Discharge Orders     None        Note:  This document was prepared using Dragon voice recognition software and may include unintentional dictation errors.    Willy Eddy, MD 03/12/23 [phone]

## 2023-03-14 NOTE — Group Note (Deleted)
